# Patient Record
Sex: Male | Born: 1960 | Race: White | Hispanic: No | Smoking: Former smoker
Health system: Southern US, Community
[De-identification: ages and names within clinical notes are randomized; demographics above are authoritative.]

## PROBLEM LIST (undated history)

## (undated) DIAGNOSIS — F17210 Nicotine dependence, cigarettes, uncomplicated: Secondary | ICD-10-CM

## (undated) DIAGNOSIS — E039 Hypothyroidism, unspecified: Secondary | ICD-10-CM

## (undated) DIAGNOSIS — I639 Cerebral infarction, unspecified: Secondary | ICD-10-CM

## (undated) HISTORY — DX: Nicotine dependence, cigarettes, uncomplicated: F17.210

---

## 1988-11-28 DIAGNOSIS — F17210 Nicotine dependence, cigarettes, uncomplicated: Secondary | ICD-10-CM

## 1988-11-28 HISTORY — DX: Nicotine dependence, cigarettes, uncomplicated: F17.210

## 2018-07-08 ENCOUNTER — Inpatient Hospital Stay (HOSPITAL_COMMUNITY)
Admission: AD | Admit: 2018-07-08 | Discharge: 2018-07-11 | DRG: 042 | Disposition: A | Discharge status: Home / Self Care | Payer: Self-pay | Source: Other Acute Inpatient Hospital | Attending: Neurology | Admitting: Neurology

## 2018-07-08 DIAGNOSIS — E785 Hyperlipidemia, unspecified: Secondary | ICD-10-CM | POA: Diagnosis present

## 2018-07-08 DIAGNOSIS — I10 Essential (primary) hypertension: Secondary | ICD-10-CM | POA: Diagnosis present

## 2018-07-08 DIAGNOSIS — R29701 NIHSS score 1: Secondary | ICD-10-CM | POA: Diagnosis present

## 2018-07-08 DIAGNOSIS — I63412 Cerebral infarction due to embolism of left middle cerebral artery: Secondary | ICD-10-CM

## 2018-07-08 DIAGNOSIS — K7689 Other specified diseases of liver: Secondary | ICD-10-CM | POA: Diagnosis present

## 2018-07-08 DIAGNOSIS — I63411 Cerebral infarction due to embolism of right middle cerebral artery: Principal | ICD-10-CM | POA: Diagnosis present

## 2018-07-08 DIAGNOSIS — E039 Hypothyroidism, unspecified: Secondary | ICD-10-CM | POA: Diagnosis present

## 2018-07-08 DIAGNOSIS — E538 Deficiency of other specified B group vitamins: Secondary | ICD-10-CM | POA: Diagnosis present

## 2018-07-08 DIAGNOSIS — I2510 Atherosclerotic heart disease of native coronary artery without angina pectoris: Secondary | ICD-10-CM | POA: Diagnosis present

## 2018-07-08 DIAGNOSIS — I700 Atherosclerosis of aorta: Secondary | ICD-10-CM | POA: Diagnosis present

## 2018-07-08 DIAGNOSIS — R4701 Aphasia: Secondary | ICD-10-CM | POA: Diagnosis present

## 2018-07-08 DIAGNOSIS — F17210 Nicotine dependence, cigarettes, uncomplicated: Secondary | ICD-10-CM | POA: Diagnosis present

## 2018-07-08 DIAGNOSIS — Z9282 Status post administration of tPA (rtPA) in a different facility within the last 24 hours prior to admission to current facility: Secondary | ICD-10-CM

## 2018-07-08 DIAGNOSIS — I639 Cerebral infarction, unspecified: Secondary | ICD-10-CM

## 2018-07-08 DIAGNOSIS — R9389 Abnormal findings on diagnostic imaging of other specified body structures: Secondary | ICD-10-CM

## 2018-07-08 DIAGNOSIS — F17200 Nicotine dependence, unspecified, uncomplicated: Secondary | ICD-10-CM | POA: Diagnosis present

## 2018-07-08 HISTORY — DX: Cerebral infarction, unspecified: I63.9

## 2018-07-08 HISTORY — DX: Hypothyroidism, unspecified: E03.9

## 2018-07-08 LAB — MRSA PCR SCREENING: MRSA by PCR: NEGATIVE

## 2018-07-08 MED ORDER — SODIUM CHLORIDE 0.9 % IV SOLN
INTRAVENOUS | Status: DC
  Administered 2018-07-08: 22:00:00 via INTRAVENOUS

## 2018-07-08 MED ORDER — ACETAMINOPHEN 160 MG/5ML PO SOLN: 650.0000 mg | ORAL | Status: DC | PRN

## 2018-07-08 MED ORDER — ACETAMINOPHEN 650 MG RE SUPP: 650.0000 mg | RECTAL | Status: DC | PRN

## 2018-07-08 MED ORDER — STROKE: EARLY STAGES OF RECOVERY BOOK
Freq: Once | Status: AC
  Administered 2018-07-09: 12:00:00
  Filled 2018-07-08: qty 1

## 2018-07-08 MED ORDER — ACETAMINOPHEN 325 MG PO TABS: 650.0000 mg | ORAL_TABLET | ORAL | Status: DC | PRN

## 2018-07-08 MED ORDER — LABETALOL HCL 5 MG/ML IV SOLN: 10.0000 mg | INTRAVENOUS | Status: DC | PRN

## 2018-07-08 NOTE — Consult Note (Signed)
Please refer H&P

## 2018-07-09 ENCOUNTER — Inpatient Hospital Stay (HOSPITAL_COMMUNITY): Payer: Self-pay

## 2018-07-09 ENCOUNTER — Other Ambulatory Visit: Payer: Self-pay

## 2018-07-09 ENCOUNTER — Encounter (HOSPITAL_COMMUNITY): Payer: Self-pay

## 2018-07-09 ENCOUNTER — Other Ambulatory Visit (HOSPITAL_COMMUNITY): Payer: Self-pay

## 2018-07-09 DIAGNOSIS — I639 Cerebral infarction, unspecified: Secondary | ICD-10-CM

## 2018-07-09 DIAGNOSIS — I6789 Other cerebrovascular disease: Secondary | ICD-10-CM

## 2018-07-09 LAB — BASIC METABOLIC PANEL
ANION GAP: 8 (ref 5–15)
BUN: 12 mg/dL (ref 6–20)
CO2: 29 mmol/L (ref 22–32)
Calcium: 9.2 mg/dL (ref 8.9–10.3)
Chloride: 103 mmol/L (ref 98–111)
Creatinine, Ser: 1.08 mg/dL (ref 0.61–1.24)
GFR calc Af Amer: >60 mL/min (ref >60)
Glucose, Bld: 105 mg/dL — ABNORMAL HIGH (ref 70–99)
POTASSIUM: 4.1 mmol/L (ref 3.5–5.1)
SODIUM: 140 mmol/L (ref 135–145)

## 2018-07-09 LAB — CBC
HCT: 43.4 % (ref 39.0–52.0)
Hemoglobin: 14.6 g/dL (ref 13.0–17.0)
MCH: 30.2 pg (ref 26.0–34.0)
MCHC: 33.6 g/dL (ref 30.0–36.0)
MCV: 89.7 fL (ref 78.0–100.0)
PLATELETS: 261 10*3/uL (ref 150–400)
RBC: 4.84 MIL/uL (ref 4.22–5.81)
RDW: 12.6 % (ref 11.5–15.5)
WBC: 7.9 10*3/uL (ref 4.0–10.5)

## 2018-07-09 LAB — ECHOCARDIOGRAM COMPLETE

## 2018-07-09 LAB — LIPID PANEL
Cholesterol: 279 mg/dL — ABNORMAL HIGH (ref 0–200)
HDL: 30 mg/dL — ABNORMAL LOW (ref >40)
LDL CALC: 187 mg/dL — AB (ref 0–99)
Total CHOL/HDL Ratio: 9.3 RATIO
Triglycerides: 308 mg/dL — ABNORMAL HIGH (ref <150)
VLDL: 62 mg/dL — AB (ref 0–40)

## 2018-07-09 LAB — GLUCOSE, CAPILLARY: Glucose-Capillary: 101 mg/dL — ABNORMAL HIGH (ref 70–99)

## 2018-07-09 LAB — VITAMIN B12: Vitamin B-12: 267 pg/mL (ref 180–914)

## 2018-07-09 LAB — HIV ANTIBODY (ROUTINE TESTING W REFLEX): HIV Screen 4th Generation wRfx: NONREACTIVE

## 2018-07-09 LAB — TSH: TSH: 10.162 u[IU]/mL — AB (ref 0.350–4.500)

## 2018-07-09 LAB — RPR: RPR: NONREACTIVE

## 2018-07-09 MED ORDER — LEVOTHYROXINE SODIUM 25 MCG PO TABS
25.0000 ug | ORAL_TABLET | Freq: Every day | ORAL | Status: DC
  Administered 2018-07-11: 25 ug via ORAL
  Filled 2018-07-09: qty 1

## 2018-07-09 MED ORDER — CLOPIDOGREL BISULFATE 75 MG PO TABS: 75.0000 mg | ORAL_TABLET | Freq: Every day | ORAL | Status: DC

## 2018-07-09 MED ORDER — ATORVASTATIN CALCIUM 80 MG PO TABS
80.0000 mg | ORAL_TABLET | Freq: Every day | ORAL | Status: DC
  Administered 2018-07-09 – 2018-07-10 (×2): 80 mg via ORAL
  Filled 2018-07-09 (×2): qty 1

## 2018-07-09 MED ORDER — LABETALOL HCL 5 MG/ML IV SOLN: 10.0000 mg | INTRAVENOUS | Status: DC | PRN

## 2018-07-09 MED ORDER — ASPIRIN EC 325 MG PO TBEC
325.0000 mg | DELAYED_RELEASE_TABLET | Freq: Every day | ORAL | Status: DC
  Administered 2018-07-09: 325 mg via ORAL
  Filled 2018-07-09: qty 1

## 2018-07-09 MED ORDER — VITAMIN B-12 1000 MCG PO TABS
1000.0000 ug | ORAL_TABLET | Freq: Every day | ORAL | Status: DC
  Administered 2018-07-11: 1000 ug via ORAL
  Filled 2018-07-09 (×2): qty 1

## 2018-07-09 MED ORDER — ASPIRIN EC 81 MG PO TBEC: 81.0000 mg | DELAYED_RELEASE_TABLET | Freq: Every day | ORAL | Status: DC

## 2018-07-09 NOTE — H&P (Addendum)
Chief Complaint: Aphasia  History obtained from: Patient and Chart    HPI:                                                                                                                                       Jordan Mendoza is an 57 y.o. male with no significant past medical history presented to [facility] emergency room as a stroke alert for aphasia, received TPA and transferred for further management to [facility].  History obtained from records sent with patient as well as speaking to patient.  Girlfriend who witnessed the event was not with the patient.   The patient apparently was not feeling like himself all day and fatigued after coming back from church.  According telemetry neurology note, he was with his girlfriend when around 5 pm he developed sudden onset difficulty speaking and has started having right arm jerking/twitching movements.  On arrival to ED he continued to have incomprehensible speech.  NIHSS was 1.  He no  motor weakness, facial droop or sensory symptoms.  Patient received IV TPA and transferred to [facility] emergency room.    Patient denies history of headaches/migraines.  He does not see a doctor frequently therefore unclear if he has any other medical problems.   Date last known well: Yesterday vs 5pm tPA Given: yes, by outside EDP NIHSS: 1 Baseline MRS 0   No past medical history on file.  No significant past medical history  No family history on file. Social History:  has no tobacco, alcohol, and drug history on file.  Allergies: Allergies not on file  Medications:                                                                                                                        I reviewed home medications   ROS:  14 systems reviewed and negative except above    Examination:                                                                                                       General: Appears well-developed and well-nourished.  Psych: Affect appropriate to situation Eyes: No scleral injection HENT: No OP obstrucion Head: Normocephalic.  Cardiovascular: Normal rate and regular rhythm.  Respiratory: Effort normal and breath sounds normal to anterior ascultation GI: Soft.  No distension. There is no tenderness.  Skin: WDI    Neurological Examination Mental Status: Alert, oriented, thought content appropriate. Mild expressive aphasia.  Able to follow 3 step commands without difficulty. Cranial Nerves: II: Visual fields grossly normal,  III,IV, VI: ptosis not present, extra-ocular motions intact bilaterally, pupils equal, round, reactive to light and accommodation V,VII: smile symmetric, facial light touch sensation normal bilaterally VIII: hearing normal bilaterally IX,X: uvula rises symmetrically XI: bilateral shoulder shrug XII: midline tongue extension Motor: Right : Upper extremity   5/5    Left:     Upper extremity   5/5  Lower extremity   5/5     Lower extremity   5/5 Tone and bulk:normal tone throughout; no atrophy noted Sensory: Pinprick and light touch intact throughout, bilaterally Deep Tendon Reflexes: 2+ and symmetric throughout Plantars: Right: downgoing   Left: downgoing Cerebellar: normal finger-to-nose, normal rapid alternating movements and normal heel-to-shin test Gait: normal gait and station     Lab Results: Basic Metabolic Panel: No results for input(s): NA, K, CL, CO2, GLUCOSE, BUN, CREATININE, CALCIUM, MG, PHOS in the last 168 hours.  CBC: No results for input(s): WBC, NEUTROABS, HGB, HCT, MCV, PLT in the last 168 hours.  Coagulation Studies: No results for input(s): LABPROT, INR in the last 72 hours.  Imaging: No results found.   ASSESSMENT AND PLAN    Possible Acute stroke vs Seizures vs Migraine  S/P IV tPA  Recommend # MRI of the brain  without contrast #MRA Head and neck  #Transthoracic Echo  #No antiplatelets until 24 hours #Repeat CT scan at 24 hours to rule out hemorrhage #Start or continue Atorvastatin 40 mg/other high intensity statin # BP goal: permissive HTN upto 180 /105 mmHg # HBAIC and Lipid profile # Telemetry monitoring # Frequent neuro checks # stroke swallow screen  Possible Seizure # Routine EEG tomorrow   Blood pressure goals <180/105 mmHg PRN labetolol ordered   DVT PPX; SCD  Please page stroke NP  Or  PA  Or MD from 8am -4 pm  as this patient from this time will be  followed by the stroke.   You can look them up on [email or web address]  Password [email or web address]     This patient is neurologically critically ill due to possible stroke and received IV tpa.  He is at risk for significant risk of neurological worsening from cerebral edema,  death from brain herniation, heart failure, hemorrhagic conversion, infection, respiratory failure and seizure. This patient's care requires constant monitoring of vital signs, hemodynamics, respiratory and cardiac monitoring, review of multiple databases, neurological assessment,  discussion with family, other specialists and medical decision making of high complexity.  I spent 50  minutes of neurocritical time in the care of this patient.     Sushanth Aroor [facility] Pager Number [phone]

## 2018-07-09 NOTE — Progress Notes (Signed)
Preliminary notes--Bilateral carotid duplex exam completed. Antegrade flow bilateral vertebral arteries.  1-39% stenosis bilateral ICAs.  Intimal thickening bilateral CCAs.  Jordan Mendoza (RDMS RVT) 07/09/18' 2:02 PM

## 2018-07-09 NOTE — H&P (View-Only) (Signed)
STROKE TEAM PROGRESS NOTE   SUBJECTIVE (INTERVAL HISTORY) His EEG tech is at the bedside for EEG.  Pt awake alert, seems still has mild word finding difficulty but much improved as per pt. He stated that he went to church and sent his friend home but they could not understood what he said. He went home, did not feel well. Could not speak at all. He also had right arm tremor, short lasting, but not at left arm. No LOC or no pass out. Dro[place] and [place] and transfer over here.    OBJECTIVE Vitals:   07/09/18 0630 07/09/18 0700 07/09/18 0730 07/09/18 0800  BP: 134/86 128/88 (!) 143/101 136/84  Pulse: 70 73 70 70  Resp: 12 11 11 14   Temp:    98.6 F (37 C)  TempSrc:    Oral  SpO2: 96% 96% 99% 95%    CBC:  Recent Labs  Lab 07/09/18 0715  WBC 7.9  HGB 14.6  HCT 43.4  MCV 89.7  PLT 261    Basic Metabolic Panel:  Recent Labs  Lab 07/09/18 0715  NA 140  K 4.1  CL 103  CO2 29  GLUCOSE 105*  BUN 12  CREATININE 1.08  CALCIUM 9.2    Lipid Panel:     Component Value Date/Time   CHOL 279 (H) 07/09/2018 0228   TRIG 308 (H) 07/09/2018 0228   HDL 30 (L) 07/09/2018 0228   CHOLHDL 9.3 07/09/2018 0228   VLDL 62 (H) 07/09/2018 0228   LDLCALC 187 (H) 07/09/2018 0228   HgbA1c: No results found for: HGBA1C Urine Drug Screen: No results found for: LABOPIA, COCAINSCRNUR, LABBENZ, AMPHETMU, THCU, LABBARB  Alcohol Level No results found for: [facility]  IMAGING I have personally reviewed the radiological images below and agree with the radiology interpretations.  CT head in [place] - no acute abnormality  CT[place] - unremarkable  MRI Brain Wo Contrast - pending  Transthoracic Echocardiogram - pending   PHYSICAL EXAM  Temp:  [98.6 F (37 C)-99 F (37.2 C)] 98.6 F (37 C) (08/12 0800) Pulse Rate:  [69-90] 78 (08/12 0900) Resp:  [11-17] 14 (08/12 0900) BP: (124-170)/(83-107) 159/106 (08/12 0900) SpO2:  [92 %-99 %] 99 % (08/12 0900)  General -  Well nourished, well developed, in no apparent distress.  Ophthalmologic - fundi not visualized due to noncooperation.  Cardiovascular - Regular rate and rhythm.  Mental Status -  Level of arousal and orientation to time, place, and person were intact. Language exam showed expression with intermittent word finding difficulty, naming 3/4, repetition intact, comprehension intact.  Fund of Knowledge was assessed and was intact.  Cranial Nerves II - XII - II - Visual field intact OU. III, IV, VI - Extraocular movements intact. V - Facial sensation intact bilaterally. VII - Facial movement intact bilaterally. VIII - Hearing & vestibular intact bilaterally. X - Palate elevates symmetrically. XI - Chin turning & shoulder shrug intact bilaterally. XII - Tongue protrusion intact.  Motor Strength - The patient's strength was normal in all extremities and pronator drift was absent.  Bulk was normal and fasciculations were absent.   Motor Tone - Muscle tone was assessed at the neck and appendages and was normal.  Reflexes - The patient's reflexes were symmetrical in all extremities and he had no pathological reflexes.  Sensory - Light touch, temperature/pinprick were assessed and were symmetrical.    Coordination - The patient had normal movements in the hands and  feet with no ataxia or dysmetria.  Tremor was absent.  Gait and Station - deferred.   ASSESSMENT/PLAN [name] is a 57 y.o. male with no significant PMHX but no regular medical follow up presenting with speech difficulties. [facility]  Stroke vs. seizure:  MRI pending  Resultant  Mild word finding difficulty  CT head - no acute abnormality  CTA head and neck - unremarkable  MRI head - pending  2D Echo - pending  EEG - pending  LDL - 187  HgbA1c - pending  UDS - pending  VTE prophylaxis - SCDs  Diet - Heart Healthy with thin liquids  No antithrombotic prior to  admission, now on No antithrombotic S/P TPA  Ongoing aggressive stroke risk factor management  Therapy recommendations:  pending  Disposition:  Pending  Hypothyroidism  TSH 10.162  Likely new diagnosis  Add synthroid low dose  Follow up with PCP  Hypertension  Stable  SBP 160s on presentation . Permissive hypertension (OK if < 180/105) but gradually normalize in 5-7 days . Long-term BP goal normotensive  Hyperlipidemia  Lipid lowering medication PTA:  none  LDL 187, goal < 70  Current lipid lowering medication: Start Lipitor 80 mg daily  Continue statin at discharge  Tobacco abuse  Current smoker  Smoking cessation counseling provided  Pt is willing to quit  Other Stroke Risk Factors  ETOH use, advised to drink no more than 2 drink(s) a day  Other Active Problems  B12 = 267 - will give supplement, goal > 500   Hospital day # 1  This patient is critically ill due to stroke vs. Seizure s/p tPA, hypothyroidism, HLD and at significant risk of neurological worsening, death form recurrent stroke, hemorrhage, cerebral edema. This patient's care requires constant monitoring of vital signs, hemodynamics, respiratory and cardiac monitoring, review of multiple databases, neurological assessment, discussion with family, other specialists and medical decision making of high complexity. I spent 40 minutes of neurocritical care time in the care of this patient.  Marvel PlanJindong Lovey Crupi, MD PhD Stroke Neurology 07/09/2018 10:29 AM     To contact Stroke Continuity provider, please refer to [email or web address]. After hours, contact General Neurology

## 2018-07-09 NOTE — Procedures (Signed)
ELECTROENCEPHALOGRAM REPORT   Patient: Jordan Mendoza       Room #: 4N23C EEG No. ID: [id number] Age: 57 y.o.        Sex: male Referring Physician: Roda ShuttersXu Report Date:  07/09/2018        Interpreting Physician: Thana FarrEYNOLDS, Kaoir Loree  History: Jordan Peltimothy Dubs is an 57 y.o. male with aphasia  Medications:  B12, Lipitor, Synthroid  Conditions of Recording:  This is a 21 channel routine scalp EEG performed with bipolar and monopolar montages arranged in accordance to the international 10/20 system of electrode placement. One channel was dedicated to EKG recording.  The patient is in the awake state.  Description:  The waking background activity consists of a low voltage, symmetrical, fairly well organized, 10 Hz alpha activity, seen from the parieto-occipital and posterior temporal regions.  Low voltage fast activity, poorly organized, is seen anteriorly and is at times superimposed on more posterior regions.  A mixture of theta and alpha rhythms are seen from the central and temporal regions. The patient does not drowse or sleep. No epileptiform activity is noted.   Hyperventilation and intermittent photic stimulation were not performed.  IMPRESSION: This is a normal awake electroencephalogram. There are no focal lateralizing or epileptiform features.   Comment:  An EEG with the patient sleep deprived to elicit drowse and light sleep may be desirable to further elicit a possible seizure disorder.     Thana FarrLeslie Avan Gullett, MD Neurology [phone] 07/09/2018, 12:45 PM

## 2018-07-09 NOTE — Progress Notes (Signed)
  Echocardiogram 2D Echocardiogram has been performed.  Jordan Mendoza 07/09/2018, 10:58 AM

## 2018-07-09 NOTE — Progress Notes (Signed)
PT Cancellation Note  Patient Details Name: Jordan Mendoza MRN: [id number] DOB: Apr 15, 1961   Cancelled Treatment:    Reason Eval/Treat Not Completed: Active bedrest order; will attempt later as time permits if updated activity order.   Elray McgregorCynthia Wynn 07/09/2018, 9:24 AM Sheran Lawlessyndi Wynn, PT [phone] 07/09/2018

## 2018-07-09 NOTE — Evaluation (Signed)
Physical Therapy Evaluation Patient Details Name: Jordan Mendoza MRN: [id number] DOB: 1961/03/12 Today's Date: 07/09/2018   History of Present Illness  [name] is a 57 y.o. male with no significant PMHX but no regular medical follow up presenting with speech difficulties.  S/p TPA at [place].  Clinical Impression  Patient presents with decreased independence with mobility due to general immobility and imbalance.  Feel he will benefit from skilled PT in the acute setting to allow return home with family support at d/c.  Will follow for higher level balance training, safety and further stroke education.     Follow Up Recommendations No PT follow up    Equipment Recommendations  None recommended by PT    Recommendations for Other Services       Precautions / Restrictions Precautions Precautions: Fall      Mobility  Bed Mobility Overal bed mobility: Needs Assistance Bed Mobility: Supine to Sit     Supine to sit: Min guard;HOB elevated     General bed mobility comments: Assist for lines  Transfers Overall transfer level: Needs assistance Equipment used: None Transfers: Sit to/from Stand Sit to Stand: Min guard         General transfer comment: for balance/safety, reaching to hold IV pole to steady himself  Ambulation/Gait Ambulation/Gait assistance: Min guard Gait Distance (Feet): 300 Feet Assistive device: None Gait Pattern/deviations: Step-through pattern;Decreased stride length     General Gait Details: initially pushed IV pole, then asked not to and able to walk without UE support  Stairs            Wheelchair Mobility    Modified Rankin (Stroke Patients Only) Modified Rankin (Stroke Patients Only) Pre-Morbid Rankin Score: No symptoms Modified Rankin: Moderately severe disability     Balance Overall balance assessment: Needs assistance   Sitting balance-Leahy Scale: Good       Standing balance-Leahy Scale: Fair                                Pertinent Vitals/Pain Pain Assessment: No/denies pain    Home Living Family/patient expects to be discharged to:: Private residence Living Arrangements: Spouse/significant other Available Help at Discharge: Available 24 hours/day;Friend(s) Type of Home: House Home Access: Stairs to enter   [employer] Corporation[employer]trance Stairs-Number of Steps: 1-2 Home Layout: One level Home Equipment: None      Prior Function Level of Independence: Independent         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     Hand Dominance   Dominant Hand: Left    Extremity/Trunk Assessment   Upper Extremity Assessment Upper Extremity Assessment: Overall WFL for tasks assessed    Lower Extremity Assessment Lower Extremity Assessment: Overall WFL for tasks assessed       Communication   Communication: Expressive difficulties  Cognition Arousal/Alertness: Awake/alert Behavior During Therapy: WFL for tasks assessed/performed Overall Cognitive Status: Impaired/Different from baseline Area of Impairment: Problem solving                             Problem Solving: Slow processing General Comments: little slowed, but oriented x 4      General Comments      Exercises     Assessment/Plan    PT Assessment Patient needs continued PT services  PT Problem List Decreased mobility;Decreased balance;Decreased knowledge of use of DME;Decreased safety awareness;Decreased knowledge of precautions  PT Treatment Interventions DME instruction;Therapeutic activities;Gait training;Therapeutic exercise;Patient/family education;Functional mobility training;Stair training;Balance training    PT Goals (Current goals can be found in the Care Plan section)  Acute Rehab PT Goals Patient Stated Goal: to return to normal PT Goal Formulation: With patient/family Time For Goal Achievement: 07/16/18 Potential to Achieve Goals: Good    Frequency Min 4X/week   Barriers to discharge         Co-evaluation               AM-PAC PT "6 Clicks" Daily Activity  Outcome Measure Difficulty turning over in bed (including adjusting bedclothes, sheets and blankets)?: A Little Difficulty moving from lying on back to sitting on the side of the bed? : A Little Difficulty sitting down on and standing up from a chair with arms (e.g., wheelchair, bedside commode, etc,.)?: A Little Help needed moving to and from a bed to chair (including a wheelchair)?: A Little Help needed walking in hospital room?: A Little Help needed climbing 3-5 steps with a railing? : A Little 6 Click Score: 18    End of Session Equipment Utilized During Treatment: Gait belt Activity Tolerance: Patient tolerated treatment well Patient left: with call bell/phone within reach;in chair;with chair alarm set;with family/visitor present   PT Visit Diagnosis: Other abnormalities of gait and mobility (R26.89)    Time: 1450-1516 PT Time Calculation (min) (ACUTE ONLY): 26 min   Charges:   PT Evaluation $PT Eval Moderate Complexity: 1 Mod PT Treatments $Gait Training: 8-22 mins        Jordan Mendoza, [place] CarolinaPT [phone] 07/09/2018   Jordan Mendoza 07/09/2018, 4:53 PM

## 2018-07-09 NOTE — Progress Notes (Signed)
STROKE TEAM PROGRESS NOTE   SUBJECTIVE (INTERVAL HISTORY) His EEG tech is at the bedside for EEG.  Pt awake alert, seems still has mild word finding difficulty but much improved as per pt. He stated that he went to church and sent his friend home but they could not understood what he said. He went home, did not feel well. Could not speak at all. He also had right arm tremor, short lasting, but not at left arm. No LOC or no pass out. Drove to [place] and got tPA and transfer over here.    OBJECTIVE Vitals:   07/09/18 0630 07/09/18 0700 07/09/18 0730 07/09/18 0800  BP: 134/86 128/88 (!) 143/101 136/84  Pulse: 70 73 70 70  Resp: 12 11 11 14  Temp:    98.6 F (37 C)  TempSrc:    Oral  SpO2: 96% 96% 99% 95%    CBC:  Recent Labs  Lab 07/09/18 0715  WBC 7.9  HGB 14.6  HCT 43.4  MCV 89.7  PLT 261    Basic Metabolic Panel:  Recent Labs  Lab 07/09/18 0715  NA 140  K 4.1  CL 103  CO2 29  GLUCOSE 105*  BUN 12  CREATININE 1.08  CALCIUM 9.2    Lipid Panel:     Component Value Date/Time   CHOL 279 (H) 07/09/2018 0228   TRIG 308 (H) 07/09/2018 0228   HDL 30 (L) 07/09/2018 0228   CHOLHDL 9.3 07/09/2018 0228   VLDL 62 (H) 07/09/2018 0228   LDLCALC 187 (H) 07/09/2018 0228   HgbA1c: No results found for: HGBA1C Urine Drug Screen: No results found for: LABOPIA, COCAINSCRNUR, LABBENZ, AMPHETMU, THCU, LABBARB  Alcohol Level No results found for: ETH  IMAGING I have personally reviewed the radiological images below and agree with the radiology interpretations.  CT head in [place] - no acute abnormality  CTA head and neck in [place] - unremarkable  MRI Brain Wo Contrast - pending  Transthoracic Echocardiogram - pending   PHYSICAL EXAM  Temp:  [98.6 F (37 C)-99 F (37.2 C)] 98.6 F (37 C) (08/12 0800) Pulse Rate:  [69-90] 78 (08/12 0900) Resp:  [11-17] 14 (08/12 0900) BP: (124-170)/(83-107) 159/106 (08/12 0900) SpO2:  [92 %-99 %] 99 % (08/12 0900)  General -  Well nourished, well developed, in no apparent distress.  Ophthalmologic - fundi not visualized due to noncooperation.  Cardiovascular - Regular rate and rhythm.  Mental Status -  Level of arousal and orientation to time, place, and person were intact. Language exam showed expression with intermittent word finding difficulty, naming 3/4, repetition intact, comprehension intact.  Fund of Knowledge was assessed and was intact.  Cranial Nerves II - XII - II - Visual field intact OU. III, IV, VI - Extraocular movements intact. V - Facial sensation intact bilaterally. VII - Facial movement intact bilaterally. VIII - Hearing & vestibular intact bilaterally. X - Palate elevates symmetrically. XI - Chin turning & shoulder shrug intact bilaterally. XII - Tongue protrusion intact.  Motor Strength - The patient's strength was normal in all extremities and pronator drift was absent.  Bulk was normal and fasciculations were absent.   Motor Tone - Muscle tone was assessed at the neck and appendages and was normal.  Reflexes - The patient's reflexes were symmetrical in all extremities and he had no pathological reflexes.  Sensory - Light touch, temperature/pinprick were assessed and were symmetrical.    Coordination - The patient had normal movements in the hands and   feet with no ataxia or dysmetria.  Tremor was absent.  Gait and Station - deferred.   ASSESSMENT/PLAN Jordan Mendoza is a 57 y.o. male with no significant PMHX but no regular medical follow up presenting with speech difficulties. [facility]  Stroke vs. seizure:  MRI pending  Resultant  Mild word finding difficulty  CT head - no acute abnormality  CTA head and neck - unremarkable  MRI head - pending  2D Echo - pending  EEG - pending  LDL - 187  HgbA1c - pending  UDS - pending  VTE prophylaxis - SCDs  Diet - Heart Healthy with thin liquids  No antithrombotic prior to  admission, now on No antithrombotic S/P TPA  Ongoing aggressive stroke risk factor management  Therapy recommendations:  pending  Disposition:  Pending  Hypothyroidism  TSH 10.162  Likely new diagnosis  Add synthroid low dose  Follow up with PCP  Hypertension  Stable  SBP 160s on presentation . Permissive hypertension (OK if < 180/105) but gradually normalize in 5-7 days . Long-term BP goal normotensive  Hyperlipidemia  Lipid lowering medication PTA:  none  LDL 187, goal < 70  Current lipid lowering medication: Start Lipitor 80 mg daily  Continue statin at discharge  Tobacco abuse  Current smoker  Smoking cessation counseling provided  Pt is willing to quit  Other Stroke Risk Factors  ETOH use, advised to drink no more than 2 drink(s) a day  Other Active Problems  B12 = 267 - will give supplement, goal > 500   Hospital day # 1  This patient is critically ill due to stroke vs. Seizure s/p tPA, hypothyroidism, HLD and at significant risk of neurological worsening, death form recurrent stroke, hemorrhage, cerebral edema. This patient's care requires constant monitoring of vital signs, hemodynamics, respiratory and cardiac monitoring, review of multiple databases, neurological assessment, discussion with family, other specialists and medical decision making of high complexity. I spent 40 minutes of neurocritical care time in the care of this patient.  [name], MD PhD Stroke Neurology 07/09/2018 10:29 AM     To contact Stroke Continuity provider, please refer to [email or web address]. After hours, contact General Neurology

## 2018-07-09 NOTE — Progress Notes (Signed)
EEG completed; results pending.    

## 2018-07-09 NOTE — Progress Notes (Signed)
Pt passed stroke swallow screen without any complications, will notify MD for diet order.

## 2018-07-09 NOTE — Progress Notes (Signed)
OT Cancellation Note  Patient Details Name: Pete Peltimothy Choudhry MRN: [id number] DOB: [birth year]   Cancelled Treatment:    Reason Eval/Treat Not Completed: Patient not medically ready(strict bedrest) OT order received and appreciated however this conflicts with current bedrest order set. Please increase activity tolerance as appropriate and remove bedrest from orders. . Please contact OT at [phone] if bed rest order is discontinued. OT will hold evaluation at this time and will check back as time allows pending increased activity orders.   Felecia ShellingJones, Yasser Hepp B   Peirce Deveney, Brynn   OTR/L Pager: [phone] Office: [phone] .  07/09/2018, 6:52 AM

## 2018-07-10 ENCOUNTER — Encounter (HOSPITAL_COMMUNITY)
Admission: AD | Disposition: A | Discharge status: Home / Self Care | Payer: Self-pay | Source: Other Acute Inpatient Hospital | Attending: Neurology

## 2018-07-10 ENCOUNTER — Inpatient Hospital Stay (HOSPITAL_COMMUNITY): Payer: Self-pay

## 2018-07-10 ENCOUNTER — Encounter (HOSPITAL_COMMUNITY): Payer: Self-pay

## 2018-07-10 DIAGNOSIS — F17200 Nicotine dependence, unspecified, uncomplicated: Secondary | ICD-10-CM | POA: Diagnosis present

## 2018-07-10 DIAGNOSIS — I63412 Cerebral infarction due to embolism of left middle cerebral artery: Secondary | ICD-10-CM

## 2018-07-10 DIAGNOSIS — E538 Deficiency of other specified B group vitamins: Secondary | ICD-10-CM | POA: Diagnosis present

## 2018-07-10 DIAGNOSIS — I6389 Other cerebral infarction: Secondary | ICD-10-CM

## 2018-07-10 DIAGNOSIS — E785 Hyperlipidemia, unspecified: Secondary | ICD-10-CM | POA: Diagnosis present

## 2018-07-10 DIAGNOSIS — E039 Hypothyroidism, unspecified: Secondary | ICD-10-CM

## 2018-07-10 DIAGNOSIS — R9389 Abnormal findings on diagnostic imaging of other specified body structures: Secondary | ICD-10-CM

## 2018-07-10 DIAGNOSIS — I63411 Cerebral infarction due to embolism of right middle cerebral artery: Secondary | ICD-10-CM | POA: Diagnosis present

## 2018-07-10 HISTORY — PX: LOOP RECORDER INSERTION: EP1214

## 2018-07-10 HISTORY — PX: TEE WITHOUT CARDIOVERSION: SHX5443

## 2018-07-10 HISTORY — DX: Hypothyroidism, unspecified: E03.9

## 2018-07-10 LAB — BASIC METABOLIC PANEL
Anion gap: 9 (ref 5–15)
BUN: 14 mg/dL (ref 6–20)
CALCIUM: 9.1 mg/dL (ref 8.9–10.3)
CO2: 28 mmol/L (ref 22–32)
CREATININE: 1.11 mg/dL (ref 0.61–1.24)
Chloride: 104 mmol/L (ref 98–111)
GFR calc Af Amer: >60 mL/min (ref >60)
Glucose, Bld: 97 mg/dL (ref 70–99)
Potassium: 4.1 mmol/L (ref 3.5–5.1)
SODIUM: 141 mmol/L (ref 135–145)

## 2018-07-10 LAB — CBC
HCT: 41.2 % (ref 39.0–52.0)
Hemoglobin: 13.8 g/dL (ref 13.0–17.0)
MCH: 30.3 pg (ref 26.0–34.0)
MCHC: 33.5 g/dL (ref 30.0–36.0)
MCV: 90.4 fL (ref 78.0–100.0)
PLATELETS: 244 10*3/uL (ref 150–400)
RBC: 4.56 MIL/uL (ref 4.22–5.81)
RDW: 12.7 % (ref 11.5–15.5)
WBC: 8.2 10*3/uL (ref 4.0–10.5)

## 2018-07-10 LAB — HEMOGLOBIN A1C
HEMOGLOBIN A1C: 5.5 % (ref 4.8–5.6)
Mean Plasma Glucose: 111 mg/dL

## 2018-07-10 SURGERY — ECHOCARDIOGRAM, TRANSESOPHAGEAL
Anesthesia: Moderate Sedation

## 2018-07-10 SURGERY — LOOP RECORDER INSERTION

## 2018-07-10 MED ORDER — FENTANYL CITRATE (PF) 100 MCG/2ML IJ SOLN
INTRAMUSCULAR | Status: AC
  Filled 2018-07-10: qty 2

## 2018-07-10 MED ORDER — LIDOCAINE-EPINEPHRINE 1 %-1:100000 IJ SOLN
INTRAMUSCULAR | Status: DC | PRN
  Administered 2018-07-10: 10 mL

## 2018-07-10 MED ORDER — ONDANSETRON HCL 4 MG/2ML IJ SOLN: 4.0000 mg | Freq: Four times a day (QID) | INTRAMUSCULAR | Status: DC | PRN

## 2018-07-10 MED ORDER — CLOPIDOGREL BISULFATE 75 MG PO TABS: 75.0000 mg | ORAL_TABLET | Freq: Every day | ORAL | 0 refills | Status: DC

## 2018-07-10 MED ORDER — CLOPIDOGREL BISULFATE 75 MG PO TABS
75.0000 mg | ORAL_TABLET | Freq: Every day | ORAL | Status: DC
  Administered 2018-07-11: 75 mg via ORAL
  Filled 2018-07-10: qty 1

## 2018-07-10 MED ORDER — HEPARIN SODIUM (PORCINE) 5000 UNIT/ML IJ SOLN
5000.0000 [IU] | Freq: Three times a day (TID) | INTRAMUSCULAR | Status: DC
  Administered 2018-07-10 – 2018-07-11 (×2): 5000 [IU] via SUBCUTANEOUS
  Filled 2018-07-10 (×2): qty 1

## 2018-07-10 MED ORDER — MIDAZOLAM HCL 5 MG/5ML IJ SOLN
INTRAMUSCULAR | Status: DC | PRN
  Administered 2018-07-10: 2 mg via INTRAVENOUS
  Administered 2018-07-10: 1 mg via INTRAVENOUS
  Administered 2018-07-10: 2 mg via INTRAVENOUS

## 2018-07-10 MED ORDER — LIDOCAINE-EPINEPHRINE 1 %-1:100000 IJ SOLN
INTRAMUSCULAR | Status: AC
  Filled 2018-07-10: qty 1

## 2018-07-10 MED ORDER — SODIUM CHLORIDE 0.9 % IV SOLN
INTRAVENOUS | Status: DC
  Administered 2018-07-10: 500 mL via INTRAVENOUS

## 2018-07-10 MED ORDER — FENTANYL CITRATE (PF) 100 MCG/2ML IJ SOLN
INTRAMUSCULAR | Status: DC | PRN
  Administered 2018-07-10 (×2): 25 ug via INTRAVENOUS

## 2018-07-10 MED ORDER — ATORVASTATIN CALCIUM 80 MG PO TABS: 80.0000 mg | ORAL_TABLET | Freq: Every day | ORAL | 2 refills | Status: DC

## 2018-07-10 MED ORDER — ACETAMINOPHEN 325 MG PO TABS
325.0000 mg | ORAL_TABLET | ORAL | Status: DC | PRN
  Administered 2018-07-10: 650 mg via ORAL
  Filled 2018-07-10 (×2): qty 2

## 2018-07-10 MED ORDER — ASPIRIN EC 81 MG PO TBEC
81.0000 mg | DELAYED_RELEASE_TABLET | Freq: Every day | ORAL | Status: DC
  Administered 2018-07-11: 81 mg via ORAL
  Filled 2018-07-10: qty 1

## 2018-07-10 MED ORDER — BUTAMBEN-TETRACAINE-BENZOCAINE 2-2-14 % EX AERO
INHALATION_SPRAY | CUTANEOUS | Status: DC | PRN
  Administered 2018-07-10: 2 via TOPICAL

## 2018-07-10 MED ORDER — ASPIRIN 81 MG PO TBEC: 81.0000 mg | DELAYED_RELEASE_TABLET | Freq: Every day | ORAL | Status: AC

## 2018-07-10 MED ORDER — MIDAZOLAM HCL 5 MG/ML IJ SOLN
INTRAMUSCULAR | Status: AC
  Filled 2018-07-10: qty 2

## 2018-07-10 MED ORDER — LEVOTHYROXINE SODIUM 25 MCG PO TABS: 25.0000 ug | ORAL_TABLET | Freq: Every day | ORAL | 2 refills | Status: DC

## 2018-07-10 MED ORDER — CYANOCOBALAMIN 1000 MCG PO TABS: 1000.0000 ug | ORAL_TABLET | Freq: Every day | ORAL | 2 refills | Status: AC

## 2018-07-10 SURGICAL SUPPLY — 2 items
LOOP REVEAL LINQSYS (Prosthesis & Implant Heart) ×2 IMPLANT
PACK LOOP INSERTION (CUSTOM PROCEDURE TRAY) ×2 IMPLANT

## 2018-07-10 NOTE — Consult Note (Addendum)
ELECTROPHYSIOLOGY CONSULT NOTE  Patient ID: Jordan Mendoza MRN: [id number], DOB/AGE: Jul 23, 1961   Admit date: 07/08/2018 Date of Consult: 07/10/2018  Primary Physician: Patient, No Pcp Per Primary Cardiologist: new to [facility] Reason for Consultation: Cryptogenic stroke; recommendations regarding Implantable Loop Recorder  History of Present Illness EP has been asked to evaluate Pete Pelt for placement of an implantable loop recorder to monitor for atrial fibrillation by Dr Roda Shutters.  The patient was admitted on 07/08/2018 with word finding difficulty.     Imaging demonstrated left MCA vessel infarct and he received tPA at [facility]. He was then transferred to [facility] for further evaluation.  he has undergone workup for stroke including echocardiogram and carotid dopplers.  The patient has been monitored on telemetry which has demonstrated sinus rhythm with no arrhythmias.  Inpatient stroke work-up is to be completed with a TEE.   Echocardiogram this admission demonstrated EF 55-60%, grade 1 diastolic dysfunction, hypokinesis of mid and apical anteroseptal myocardium.  Lab work is reviewed.  Prior to admission, the patient denies chest pain, shortness of breath, dizziness, palpitations, or syncope.  They are recovering from their stroke with plans to return home at discharge.    Past Medical History:  Diagnosis Date  . Smokes 1/2 pack a day or less 1990   started thirty years ago     No medications prior to admission.    Inpatient Medications:  . [MAR Hold] aspirin EC  325 mg Oral Daily  . [MAR Hold] atorvastatin  80 mg Oral q1800  . [MAR Hold] levothyroxine  25 mcg Oral QAC breakfast  . [MAR Hold] vitamin B-12  1,000 mcg Oral Daily    Allergies: No Known Allergies  Social History   Socioeconomic History  . Marital status: Widowed    Spouse name: Not on file  . Number of children: 3  . Years of education: Not on file  . Highest education level: Not on file    Occupational History  . Not on file  Social Needs  . Financial resource strain: Not on file  . Food insecurity:    Worry: Not on file    Inability: Not on file  . Transportation needs:    Medical: Not on file    Non-medical: Not on file  Tobacco Use  . Smoking status: Current Every Day Smoker    Packs/day: 2.00    Years: 30.00    Pack years: 60.00    Types: Cigarettes    Start date: 02/06/1989  . Smokeless tobacco: Never Used  Substance and Sexual Activity  . Alcohol use: Not on file  . Drug use: Never  . Sexual activity: Yes  Lifestyle  . Physical activity:    Days per week: 2 days    Minutes per session: 30 min  . Stress: To some extent  Relationships  . Social connections:    Talks on phone: More than three times a week    Gets together: Not on file    Attends religious service: More than 4 times per year    Active member of club or organization: Not on file    Attends meetings of clubs or organizations: Not on file    Relationship status: Not on file  . Intimate partner violence:    Fear of current or ex partner: No    Emotionally abused: No    Physically abused: No    Forced sexual activity: No  Other Topics Concern  . Not on file  Social  History Narrative  . Not on file    Family History: no premature CAD   Review of Systems: All other systems reviewed and are otherwise negative except as noted above.  Physical Exam: Vitals:   07/09/18 2100 07/09/18 2323 07/10/18 0359 07/10/18 0756  BP:  (!) 146/92 124/87 139/87  Pulse:  75 74 77  Resp:  18  18  Temp:  98.9 F (37.2 C) 98.3 F (36.8 C) 97.7 F (36.5 C)  TempSrc:  Oral Oral Oral  SpO2:  98% 98% 96%  Weight: 90.3 kg     Height: 5\' 7"  (1.702 m)       GEN- The patient is well appearing, alert and oriented x 3 today.   Head- normocephalic, atraumatic Eyes-  Sclera clear, conjunctiva pink Ears- hearing intact Oropharynx- clear Neck- supple Lungs- Clear to ausculation bilaterally, normal work  of breathing Heart- Regular rate and rhythm  GI- soft, NT, ND, + BS Extremities- no clubbing, cyanosis, or edema MS- no significant deformity or atrophy Skin- no rash or lesion Psych- euthymic mood, full affect   Labs:   Lab Results  Component Value Date   WBC 8.2 07/10/2018   HGB 13.8 07/10/2018   HCT 41.2 07/10/2018   MCV 90.4 07/10/2018   PLT 244 07/10/2018    Recent Labs  Lab 07/10/18 0316  NA 141  K 4.1  CL 104  CO2 28  BUN 14  CREATININE 1.11  CALCIUM 9.1  GLUCOSE 97     Radiology/Studies: Mr Brain Wo Contrast  Result Date: 07/09/2018 CLINICAL DATA:  Stroke.  Follow-up after tPA administration. EXAM: MRI HEAD WITHOUT CONTRAST TECHNIQUE: Multiplanar, multiecho pulse sequences of the brain and surrounding structures were obtained without intravenous contrast. COMPARISON:  None. FINDINGS: Brain: There is an approximately 2-3 cm region of patchy acute infarction in the left deep insula and frontal operculum region. Mild swelling but no significant mass effect. There are petechial blood products without frank hematoma. No other region of acute infarction. Elsewhere, the brain appears normal. No evidence of pre-existing small-vessel disease. The brainstem and cerebellum are entirely normal. No mass lesion, hydrocephalus or extra-axial collection. Vascular: Major vessels at the base of the brain show flow. Skull and upper cervical spine: Negative Sinuses/Orbits: Clear/normal Other: None IMPRESSION: Findings consistent with a left MCA branch vessel infarction with a 2-3 cm region of acute infarction in the left deep insula and frontal operculum. Mild swelling. Petechial blood products without frank hematoma. No significant mass effect or shift. Otherwise negative scan. Electronically Signed   By: Paulina FusiMark  Shogry M.D.   On: 07/09/2018 16:18    Telemetry sinus rhythm (personally reviewed)  Assessment and Plan:  1. Cryptogenic stroke The patient presents with cryptogenic stroke.   The patient has a TEE planned for this AM.  I spoke at length with the patient about monitoring for afib with an implantable loop recorder.  Risks, benefits, and alteratives to implantable loop recorder were discussed with the patient today.   At this time, the patient is very clear in their decision to proceed with implantable loop recorder.   2.  Wall motion abnormalities on echo May benefit from outpatient myoview He has not had ischemic symptoms Will await TEE   Wound care was reviewed with the patient (keep incision clean and dry for 3 days).  Wound check scheduled and entered in AVS.  Please call with questions.   Gypsy BalsamAmber Seiler, NP 07/10/2018 10:13 AM  EP Attending  Patient seen and examined. Agree  with the findings as noted above. They represent my assessment and plan as well. He has had a TEE which did not show an explanation for his stroke. He will undergo insertion of an ILR. I have discussed the indications/risks/benefits/goals/expectations of the procedure with the patient and he wishes to proceed.  Leonia ReevesGregg Genesys Coggeshall,M.D.

## 2018-07-10 NOTE — Interval H&P Note (Signed)
History and Physical Interval Note:  07/10/2018 11:02 AM  Jordan Mendoza  has presented today for surgery, with the diagnosis of stroke  The various methods of treatment have been discussed with the patient and family. After consideration of risks, benefits and other options for treatment, the patient has consented to  Procedure(s): TRANSESOPHAGEAL ECHOCARDIOGRAM (TEE) (N/A) as a surgical intervention .  The patient's history has been reviewed, patient examined, no change in status, stable for surgery.  I have reviewed the patient's chart and labs.  Questions were answered to the patient's satisfaction.     Terriana Barreras [employer]

## 2018-07-10 NOTE — Discharge Summary (Addendum)
Stroke Discharge Summary  Patient ID: Jordan Mendoza   MRN: [id number]      DOB: [birth year]  Date of Admission: 07/08/2018 Date of Discharge: 07/11/2018  Attending Physician:  [name], MD, Stroke MD Consultant(s):     Lewayne Bunting, MD (electrophysiology)  Patient's PCP:  Patient, No Pcp Per  DISCHARGE DIAGNOSIS:  Principal Problem:   Cerebral infarction due to embolism of right middle cerebral artery (HCC) s/p tPA Active Problems:   Hypothyroid   Hyperlipidemia   Tobacco use disorder   Low serum vitamin B12   Liver cysts   Coronary atherosclerosis   Past Medical History:  Diagnosis Date  . Hypothyroid 07/10/2018  . Smokes 1/2 pack a day or less 1990   started thirty years ago  . Stroke (cerebrum) (HCC) 07/08/2018   Past Surgical History:  Procedure Laterality Date  . LOOP RECORDER INSERTION N/A 07/10/2018   Procedure: LOOP RECORDER INSERTION;  Surgeon: Marinus Maw, MD;  Location: [facility] INVASIVE CV LAB;  Service: Cardiovascular;  Laterality: N/A;  . TEE WITHOUT CARDIOVERSION N/A 07/10/2018   Procedure: TRANSESOPHAGEAL ECHOCARDIOGRAM (TEE);  Surgeon: Laurey Morale, MD;  Location: [facility] ENDOSCOPY;  Service: Cardiovascular;  Laterality: N/A;    Allergies as of 07/11/2018   No Known Allergies     Medication List    TAKE these medications   aspirin 81 MG EC tablet Take 1 tablet (81 mg total) by mouth daily.   atorvastatin 80 MG tablet Commonly known as:  LIPITOR Take 1 tablet (80 mg total) by mouth daily at 6 PM.   clopidogrel 75 MG tablet Commonly known as:  PLAVIX Take 1 tablet (75 mg total) by mouth daily.   cyanocobalamin 1000 MCG tablet Take 1 tablet (1,000 mcg total) by mouth daily.   levothyroxine 25 MCG tablet Commonly known as:  SYNTHROID, LEVOTHROID Take 1 tablet (25 mcg total) by mouth daily before breakfast.       LABORATORY STUDIES CBC    Component Value Date/Time   WBC 8.1 07/11/2018 0524   RBC 4.58 07/11/2018 0524   HGB 13.5  07/11/2018 0524   HCT 41.4 07/11/2018 0524   PLT 230 07/11/2018 0524   MCV 90.4 07/11/2018 0524   MCH 29.5 07/11/2018 0524   MCHC 32.6 07/11/2018 0524   RDW 12.5 07/11/2018 0524   CMP    Component Value Date/Time   NA 140 07/11/2018 0524   K 3.9 07/11/2018 0524   CL 105 07/11/2018 0524   CO2 27 07/11/2018 0524   GLUCOSE 97 07/11/2018 0524   BUN 15 07/11/2018 0524   CREATININE 1.08 07/11/2018 0524   CALCIUM 8.9 07/11/2018 0524   GFRNONAA >60 07/11/2018 0524   GFRAA >60 07/11/2018 0524   Lipid Panel    Component Value Date/Time   CHOL 279 (H) 07/09/2018 0228   TRIG 308 (H) 07/09/2018 0228   HDL 30 (L) 07/09/2018 0228   CHOLHDL 9.3 07/09/2018 0228   VLDL 62 (H) 07/09/2018 0228   LDLCALC 187 (H) 07/09/2018 0228   HgbA1C  Lab Results  Component Value Date   HGBA1C 5.5 07/09/2018    SIGNIFICANT DIAGNOSTIC STUDIES CXR in [place] - widened mediastinum  CT head in [place] - no acute abnormality  CTA head and neck in [place] - unremarkable  MRI Brain Wo Contrast  Findings consistent with a left MCA branch vessel infarction with a 2-3 cm region of acute infarction in the left deep insula and frontal operculum. Mild swelling. Petechial blood  products without frank hematoma. No significant mass effect or shift. Otherwise negative scan.  Transthoracic Echocardiogram  - Left ventricle: The cavity size was normal. Wall thickness was normal. The estimated ejection fraction was in the range of 55% to 60%. Hypokinesis of the mid and apical anteroseptal and anterior myocardium. Doppler parameters are consistent with abnormal left ventricular relaxation (grade 1 diastolic dysfunction).  TEE Normal LV size with mild focal basal septal hypertrophy.  EF 50-55% with mid to apical anteroseptal and apical anterior severe hypokinesis.  Normal RV size and systolic function.   Normal right atrial size.  Normal left atrial size, no LA appendage thrombus.  Trivial TR.  Trivial MR.   Trileaflet aortic valve with no stenosis or regurgitation.  Normal caliber thoracic aorta with grade 3 plaque in the descending thoracic aorta.  Impression: No source of embolism.  Wall motion abnormalities concerning for prior MI.   Carotid Doppler Antegrade flow bilateral vertebral arteries. 1-39% stenosis bilateral ICAs. Intimal thickening bilateral CCAs  EEG This is a normalawakeelectroencephalogram. There are no focal lateralizing or epileptiform features.  CT chest 07/11/2018 1. Negative for mediastinal mass. Mediastinal widening on chest x-ray was likely technical. 2. Aortic and coronary atherosclerotic calcification. No embolic source seen in the chest in this patient with acute stroke. 3. Numerous cystic densities in the liver.   HISTORY OF PRESENT ILLNESS Jordan Mendoza is an 57 y.o. male with no significant past medical history presented to [facility] emergency room as a stroke alert for aphasia, received TPA and transferred for further management to [facility].  History obtained from records sent with patient as well as speaking to patient.  Girlfriend who witnessed the event was not with the patient.  The patient apparently was not feeling like himself all day and fatigued after coming back from church.  According to the teleneurology note, he was with his girlfriend when around 5 pm on 07/07/2018 he developed sudden onset difficulty speaking and has started having right arm jerking/twitching movements.  On arrival to ED he continued to have incomprehensible speech.  NIHSS was 1.  He had no  motor weakness, facial droop or sensory symptoms.  Patient received IV TPA and transferred to [facility] emergency room.   Patient denies history of headaches/migraines.  He does not see a doctor frequently therefore unclear if he has any other medical problems. NIHSS: 1.  Baseline MRS 0    HOSPITAL COURSE [name] is a 57 y.o. male with no significant  PMHX but no regular medical follow up presenting with speech difficulties. tPA given  at [facility]. Transferred to [facility]. Found to have L MCA infarcts, embolic from unknown source. TEE neg. Loop recorder placed to look for AF. Speech improved in house. No other neuro deficits. OP SLP recommended. Plan D/c home.  Stroke:  R MCA embolic secondary to unknown source  Resultant  Mild word finding difficulty  CT head - no acute abnormality  CTA head and neck - unremarkable  MRI head - L MCA infarct (L insula and frontal operculum). Petechial blood products negative scan.  CD - B ICA 1-39% stenosis, VAs antegrade   2D Echo - EF 55-60%. No source of embolus   LE venous doppler - canceled. TEE neg for PFO  TEE no PFO, EF 50-55%. No source of embolus. Wall motion abnormalities  Concerning for previous MI  EEG - normal  LDL - 187  HgbA1c - 5.5  Hypercoagulable work up negative   RPR  negative   No antithrombotic prior to admission, now on aspirin and plavix x 3 weeks then aspirin alone.  Therapy recommendations:  OP SLP recommended for language. Pt feels he will improve on his own. No PT or OT needs  Disposition:  return home  Hypothyroidism  TSH 10.162  Likely new diagnosis  Add synthroid low dose  Follow up with PCP  Blood Pressure  Stable  SBP 160s on presentation, normalizing now  BP goal normotensive  Hyperlipidemia  Lipid lowering medication PTA:  none  LDL 187, goal < 70  Current lipid lowering medication: Started Lipitor 80 mg daily  Continue statin at discharge  Tobacco abuse  Current smoker  Smoking cessation counseling provided  Pt is willing to quit  Other Stroke Risk Factors  ETOH use, advised to drink no more than 2 drink(s) a day  ? CAD - unknown hx. Wall motion abnormalities  On 2D concerning for previous MI Aortic and coronary atherosclerotic calcification seen on CT chest  Other Active Problems  B12 = 267 - will give  supplement, goal > 500  CXR at [place] with widened mediastinum. CT chest Negative for mediastinal mass. Mediastinal widening on chest x-ray was likely technical.  Numerous cystic densities in the liver.   DISCHARGE EXAM Blood pressure 127/80, pulse 88, temperature 97.8 F (36.6 C), temperature source Oral, resp. rate 18, height 5\' 7"  (1.702 m), weight 90.3 kg, SpO2 94 %. General - Well nourished, well developed, in no apparent distress.  Cardiovascular - Regular rate and rhythm.  Mental Status -  Level of arousal and orientation to time, place, and person were intact. Language exam showed expression with intermittent word finding difficulty, able to name, repetition intact, comprehension intact.  Fund of Knowledge was assessed and was intact.  Cranial Nerves II - XII - II - Visual field intact OU. III, IV, VI - Extraocular movements intact. V - Facial sensation intact bilaterally. VII - Facial movement intact bilaterally. VIII - Hearing & vestibular intact bilaterally. X - Palate elevates symmetrically. XI - Chin turning & shoulder shrug intact bilaterally. XII - Tongue protrusion intact.  Motor Strength - The patient's strength was normal in all extremities and pronator drift was absent.  Bulk was normal and fasciculations were absent.   Motor Tone - Muscle tone was assessed at the neck and appendages and was normal.  Sensory - Light touch, temperature/pinprick were assessed and were symmetrical.    Coordination - The patient had normal movements in the hands and feet with no ataxia or dysmetria.  Tremor was absent.  Gait and Station - deferred.  Discharge Diet   Heart healthy thin liquids  DISCHARGE PLAN  Disposition:  Home  aspirin 81 mg daily and clopidogrel 75 mg daily x 3 weeks then aspirin alone for secondary stroke prevention.  Ongoing risk factor control by Primary Care Physician at time of discharge  Follow-up PCP 2 weeks. Patient instructed to get  one.   Consider cardiology follow up for ? Hx MI, coronary atherosclerosis   Address cystic liver lesions  Follow-up in [facility] in 4 weeks, office to schedule an appointment.   45 minutes were spent preparing discharge.  Annie MainSharon Biby, MSN, APRN, ANVP-BC, AGPCNP-BC Advanced Practice Stroke Nurse [facility] See Amion for Schedule & Pager information 07/11/2018 11:45 AM    ATTENDING NOTE: I reviewed above note and agree with the assessment and plan. I have made any additions or clarifications directly to the above  note.   No acute event overnight. Had chest CT showed no acute abnormality and abnormal CXR likely artifact. His hypercoagulable labs were negative. He needs to establish PCP and also likely needs cardiology follow up as outpt. Also needs follow up with PCP for hypothyroidism, will d/c with low dose synthroid. Continue DAPT for 3 weeks and then ASA alone. Continue high dose statin. Recommend outpt speech. Ready for discharge. Will follow up with [facility] stroke clinic in 4 weeks.   Marvel PlanJindong Daekwon Beswick, MD PhD Stroke Neurology 07/11/2018 6:43 PM

## 2018-07-10 NOTE — CV Procedure (Signed)
Procedure: TEE  Indication: CVA  Sedation: Versed 5 mg IV, Fentanyl 50 mcg IV  Findings: Please see echo section for full report.  Normal LV size with mild focal basal septal hypertrophy.  EF 50-55% with mid to apical anteroseptal and apical anterior severe hypokinesis.  Normal RV size and systolic function.   Normal right atrial size.  Normal left atrial size, no LA appendage thrombus.  Trivial TR.  Trivial MR.  Trileaflet aortic valve with no stenosis or regurgitation.  Normal caliber thoracic aorta with grade 3 plaque in the descending thoracic aorta.   Impression: No source of embolism.  Wall motion abnormalities concerning for prior MI.   Jordan Mendoza 07/10/2018 11:19 AM

## 2018-07-10 NOTE — Progress Notes (Signed)
Physical Therapy Treatment Patient Details Name: Jordan Mendoza MRN: [id number] DOB: [birth year] Today's Date: 07/10/2018    History of Present Illness [name] is a 57 y.o. male with no significant PMHX but no regular medical follow up presenting with speech difficulties.  S/p TPA at [facility].    PT Comments    Patient is making progress toward PT goals. Pt with DGI score of 22/24. Continue to progress as tolerated. Current plan remains appropriate.    Follow Up Recommendations  No PT follow up     Equipment Recommendations  None recommended by PT    Recommendations for Other Services       Precautions / Restrictions Precautions Precautions: Fall    Mobility  Bed Mobility Overal bed mobility: Modified Independent Bed Mobility: Supine to Sit;Sit to Supine     Supine to sit: Supervision        Transfers Overall transfer level: Modified independent Equipment used: None Transfers: Sit to/from Stand Sit to Stand: Supervision            Ambulation/Gait Ambulation/Gait assistance: Supervision Gait Distance (Feet): 300 Feet Assistive device: None Gait Pattern/deviations: Step-through pattern;Decreased stride length Gait velocity: decreased(able to change gait speed without difficulty )   General Gait Details: cues for increased cadence and stride length   Stairs             Wheelchair Mobility    Modified Rankin (Stroke Patients Only) Modified Rankin (Stroke Patients Only) Pre-Morbid Rankin Score: No symptoms Modified Rankin: Moderate disability     Balance Overall balance assessment: Needs assistance   Sitting balance-Leahy Scale: Good       Standing balance-Leahy Scale: Good                   Standardized Balance Assessment Standardized Balance Assessment : Dynamic Gait Index   Dynamic Gait Index Level Surface: Normal Change in Gait Speed: Normal Gait with Horizontal Head Turns: Normal Gait with Vertical Head  Turns: Mild Impairment Gait and Pivot Turn: Normal Step Over Obstacle: Mild Impairment Step Around Obstacles: Normal Steps: Normal Total Score: 22      Cognition Arousal/Alertness: Awake/alert Behavior During Therapy: WFL for tasks assessed/performed Overall Cognitive Status: Within Functional Limits for tasks assessed                               Problem Solving: Slow processing General Comments: little slowed, but oriented x 4      Exercises      General Comments        Pertinent Vitals/Pain Pain Assessment: No/denies pain    Home Living Family/patient expects to be discharged to:: Private residence Living Arrangements: Spouse/significant other Available Help at Discharge: Available 24 hours/day;Friend(s) Type of Home: House Home Access: Stairs to enter   Home Layout: One level Home Equipment: None      Prior Function Level of Independence: Independent      Comments: worked on cars   PT Goals (current goals can now be found in the care plan section) Acute Rehab PT Goals Patient Stated Goal: to return to normal PT Goal Formulation: With patient/family Time For Goal Achievement: 07/16/18 Potential to Achieve Goals: Good Progress towards PT goals: Progressing toward goals    Frequency    Min 4X/week      PT Plan Current plan remains appropriate    Co-evaluation  AM-PAC PT "6 Clicks" Daily Activity  Outcome Measure  Difficulty turning over in bed (including adjusting bedclothes, sheets and blankets)?: None Difficulty moving from lying on back to sitting on the side of the bed? : None Difficulty sitting down on and standing up from a chair with arms (e.g., wheelchair, bedside commode, etc,.)?: None Help needed moving to and from a bed to chair (including a wheelchair)?: None Help needed walking in hospital room?: A Little Help needed climbing 3-5 steps with a railing? : A Little 6 Click Score: 22    End of Session  Equipment Utilized During Treatment: Gait belt Activity Tolerance: Patient tolerated treatment well Patient left: with call bell/phone within reach;with family/visitor present;in bed Nurse Communication: Mobility status PT Visit Diagnosis: Other abnormalities of gait and mobility (R26.89)     Time: [id number] PT Time Calculation (min) (ACUTE ONLY): 19 min  Charges:  $Gait Training: 8-22 mins                     Erline LevineKellyn Erdine Hulen, PTA Pager: [phone]     Carolynne EdouardKellyn R Alyha Marines 07/10/2018, 9:41 AM

## 2018-07-10 NOTE — Evaluation (Signed)
Occupational Therapy Evaluation and Discharge Patient Details Name: Jordan Peltimothy Kolenda MRN: [id number] DOB: Jan 23, 1961 Today's Date: 07/10/2018    History of Present Illness [name] is a 57 y.o. male with no significant PMHX but no regular medical follow up presenting with speech difficulties.  S/p TPA at [facility].   Clinical Impression   Pt presents to OT with mild balance impairments impacting ability to complete ADLs at baseline due to general immobility and imbalance.  Pt completed functional transfers in bathroom with supervision without use of AD.  Pt able to complete LB dressing tasks without assist.  Pt's family present throughout session and reporting pt at baseline and speech has returned to baseline.  Pt will not require any further OT intervention at this time.    Follow Up Recommendations  No OT follow up    Equipment Recommendations  None recommended by OT    Recommendations for Other Services       Precautions / Restrictions Precautions Precautions: Fall      Mobility Bed Mobility Overal bed mobility: Modified Independent Bed Mobility: Supine to Sit;Sit to Supine     Supine to sit: Supervision        Transfers Overall transfer level: Modified independent Equipment used: None Transfers: Sit to/from Stand Sit to Stand: Supervision              Balance Overall balance assessment: Needs assistance   Sitting balance-Leahy Scale: Good       Standing balance-Leahy Scale: Good                   Standardized Balance Assessment Standardized Balance Assessment : Dynamic Gait Index   Dynamic Gait Index Level Surface: Normal Change in Gait Speed: Normal Gait with Horizontal Head Turns: Normal Gait with Vertical Head Turns: Mild Impairment Gait and Pivot Turn: Normal Step Over Obstacle: Mild Impairment Step Around Obstacles: Normal Steps: Normal Total Score: 22     ADL either performed or assessed with clinical judgement    ADL Overall ADL's : Needs assistance/impaired                     Lower Body Dressing: Independent;Sitting/lateral leans   Toilet Transfer: Supervision/safety       Tub/ Shower Transfer: Therapist, sportsupervision/safety Tub/Shower Transfer Details (indicate cue type and reason): stepped over ledge in room shower, pt reports having walk-in shower similar to hospital shower at home Functional mobility during ADLs: Supervision/safety General ADL Comments: Completed toilet transfers and walk-in shower transfer with supervision.  LB dressing with donning socks in sitting at Independent level.  Ambulated 150' without AD with supervision, somewhat slow but without LOB.     Vision Baseline Vision/History: (reports he should wear glasses but does not) Patient Visual Report: No change from baseline Vision Assessment?: No apparent visual deficits            Pertinent Vitals/Pain Pain Assessment: No/denies pain     Hand Dominance Left   Extremity/Trunk Assessment Upper Extremity Assessment Upper Extremity Assessment: Overall WFL for tasks assessed   Lower Extremity Assessment Lower Extremity Assessment: Overall WFL for tasks assessed       Communication Communication Communication: Expressive difficulties(improving)   Cognition Arousal/Alertness: Awake/alert Behavior During Therapy: WFL for tasks assessed/performed Overall Cognitive Status: Within Functional Limits for tasks assessed                               Problem Solving:  Slow processing General Comments: little slowed, but oriented x 4              Home Living Family/patient expects to be discharged to:: Private residence Living Arrangements: Spouse/significant other Available Help at Discharge: Available 24 hours/day;Friend(s) Type of Home: House Home Access: Stairs to enter Enter[employer] CorporationEntrance Stairs-Number of Steps: 2-3   Home Layout: One level     Bathroom Shower/Tub: Associate ProfessorWalk-in shower         Home  Equipment: None          Prior Functioning/Environment Level of Independence: Independent        Comments: worked on Community education officercars        OT Problem List: Decreased activity tolerance;Decreased safety awareness         OT Goals(Current goals can be found in the care plan section) Acute Rehab OT Goals Patient Stated Goal: to return to normal OT Goal Formulation: All assessment and education complete, DC therapy   AM-PAC PT "6 Clicks" Daily Activity     Outcome Measure Help from another person eating meals?: None Help from another person taking care of personal grooming?: None Help from another person toileting, which includes using toliet, bedpan, or urinal?: A Little Help from another person bathing (including washing, rinsing, drying)?: A Little Help from another person to put on and taking off regular upper body clothing?: A Little Help from another person to put on and taking off regular lower body clothing?: A Little 6 Click Score: 20   End of Session Equipment Utilized During Treatment: Gait belt Nurse Communication: Mobility status  Activity Tolerance: Patient tolerated treatment well Patient left: in chair;with call bell/phone within reach;with family/visitor present  OT Visit Diagnosis: Unsteadiness on feet (R26.81);Other abnormalities of gait and mobility (R26.89)                Time: [id number] OT Time Calculation (min): 18 min Charges:  OT General Charges $OT Visit: 1 Visit OT Evaluation $OT Eval Low Complexity: 1 Low  Judie Hollick, [phone] 07/10/2018, 9:50 AM

## 2018-07-10 NOTE — Progress Notes (Signed)
SLP Cancellation Note  Patient Details Name: Jordan Mendoza MRN: [id number] DOB: 12-20-1960   Cancelled treatment:      Attempted to see pt for speech language evaluation.  Spoke with family. Pt was off floor for TEE at time of attempt.  Will reattempt as SLP schedule permits.                                                                                              Jordan Mendoza 07/10/2018, 10:45 AM

## 2018-07-10 NOTE — Evaluation (Signed)
Speech Language Pathology Evaluation Patient Details Name: Jordan Mendoza MRN: [id number] DOB: 12/12/1960 Today's Date: 07/10/2018 Time: [id number] SLP Time Calculation (min) (ACUTE ONLY): 26 min  Problem List:  Patient Active Problem List   Diagnosis Date Noted  . 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 Smokes 1/2 pack a day or less 1990   started thirty years ago   Past Surgical History:  Past Surgical History:  Procedure Laterality Date  . LOOP RECORDER INSERTION N/A 07/10/2018   Procedure: LOOP RECORDER INSERTION;  Surgeon: Marinus Mawaylor, Gregg W, MD;  [facility];  Service: Cardiovascular;  Laterality: N/A;   HPI:  Jordan Peltimothy Kneebone is a 57 y.o. male with no significant past medical history who [facility] as a stroke alert for aphasia, received TPA and [facility].  MRI revealed "Findings consistent with a left MCA branch vessel infarction with a -3 cm region of acute infarction in the left deep insula and frontal operculum"   Assessment / Plan / Recommendation Clinical Impression  Pt presents with mild expressive and receptive language deficits.  Pt exhibited word finding difficulty with semantic and phonemic paraphasias ("shoehorn" for "horseshoe", "squid" for "octopus").  Pt was typically aware of errors ("is that right?") and self corrected in 1 of 2 opportunities. Pt demonstrated ability to use circumlocution and describe target word, but did not benefit from phonemic cue x1.  Pt exhibited errors with command following, mixing up instructions; for example, pointing to an item he was instructed to pick up and picking up item he was supposed to point to.  Pt's speech was clear and free from dysarthria.  Cognition was assessed using the COGNISTAT (see below for scores).  Pt performed within average range on most tasks. Deficits in  language tasks are described above. Pt exhibited some difficulty with calculations.  Pt reports that this is his baseline. Pt reports that he does not require much math during his daily work and does not feel this will impact is ability to perform his job or manage his finances.  Pt performed within average range on delayed recall task.  Registration, however, required 7 trials and pt reports feeling that he is having difficulty with memory.  Pt would benefit from speech therapy to address deficits noted above and will likely need to continue treatment at next level of care.  COGNISTAT - All subtests are within the average range except where otherwise indicated Orientation: 12/12 Attention: 6/8 Comprehension: 4/6, Mild impairment Repetition: 11/12 Naming: 6/8, Borderline impairment Construction: not assessed Memory: 11/12 Calculation: 2/4, Mild impairment Similarities: 7/8 Judgment: 5/6    SLP Assessment  SLP Recommendation/Assessment: Patient needs continued Speech Lanaguage Pathology Services SLP Visit Diagnosis: Aphasia (R47.01);Cognitive communication deficit (R41.841)    Follow Up Recommendations  Home health SLP;Outpatient SLP    Frequency and Duration min 1 x/week  2 weeks      SLP Evaluation Cognition  Overall Cognitive Status: Impaired/Different from baseline Orientation Level: Oriented X4 Attention: Focused;Sustained Focused Attention: Appears intact Sustained Attention: Appears intact Memory: Impaired Memory Impairment: Storage deficit Problem Solving: Appears intact Executive Function: Reasoning Reasoning: Appears intact       Comprehension  Auditory Comprehension Overall Auditory Comprehension: Impaired Commands: Impaired Multistep Basic Commands: 50-74% accurate    Expression Expression Primary Mode of Expression: Verbal Verbal Expression Overall Verbal Expression: Impaired Repetition: No impairment Naming: Impairment Confrontation: Impaired Verbal  Errors:  Semantic paraphasias;Phonemic paraphasias   Oral / Motor      GO                    Opie Fanton E Damarie Schoolfield, MA, CCC-SLP.  Pager [phone] 07/10/2018, 3:31 PM

## 2018-07-10 NOTE — Progress Notes (Addendum)
STROKE TEAM PROGRESS NOTE   SUBJECTIVE (INTERVAL HISTORY) His girlfriend is at the bedside. TEE neg. Loop in place. Planned d/c on hold d/t CT chest needed. Will d/c in am.   OBJECTIVE Vitals:   07/10/18 1050 07/10/18 1125 07/10/18 1200 07/10/18 1311  BP: 126/61 140/76 118/66 132/78  Pulse: 73 90 71 77  Resp: 13 13 17 16   Temp:    98 F (36.7 C)  TempSrc:    Oral  SpO2: 98% 94% 95% 98%  Weight:      Height:        CBC:  Recent Labs  Lab 07/09/18 0715 07/10/18 0316  WBC 7.9 8.2  HGB 14.6 13.8  HCT 43.4 41.2  MCV 89.7 90.4  PLT 261 244    Basic Metabolic Panel:  Recent Labs  Lab 07/09/18 0715 07/10/18 0316  NA 140 141  K 4.1 4.1  CL 103 104  CO2 29 28  GLUCOSE 105* 97  BUN 12 14  CREATININE 1.08 1.11  CALCIUM 9.2 9.1    Lipid Panel:     Component Value Date/Time   CHOL 279 (H) 07/09/2018 0228   TRIG 308 (H) 07/09/2018 0228   HDL 30 (L) 07/09/2018 0228   CHOLHDL 9.3 07/09/2018 0228   VLDL 62 (H) 07/09/2018 0228   LDLCALC 187 (H) 07/09/2018 0228   HgbA1c:  Lab Results  Component Value Date   HGBA1C 5.5 07/09/2018   Urine Drug Screen: No results found for: LABOPIA, COCAINSCRNUR, LABBENZ, AMPHETMU, THCU, LABBARB  Alcohol Level No results found for: ETH  IMAGING CT head in [place] - no acute abnormality  CTA head and neck in [place] - unremarkable  MRI Brain Wo Contrast -  Findings consistent with a left MCA branch vessel infarction with a 2-3 cm region of acute infarction in the left deep insula and frontal operculum. Mild swelling. Petechial blood products without frank hematoma. No significant mass effect or shift. Otherwise negative scan.  Carotid Doppler Antegrade flow bilateral vertebral arteries.  1-39% stenosis bilateral ICAs.  Intimal thickening bilateral CCAs  Transthoracic Echocardiogram  - Left ventricle: The cavity size was normal. Wall thickness wasnormal. The estimated ejection fraction was in the range of 55%to 60%. Hypokinesis  of the mid and apical anteroseptal andanterior myocardium. Doppler parameters are consistent withabnormal left ventricular relaxation (grade 1 diastolicdysfunction).  TEE Normal LV size with mild focal basal septal hypertrophy. EF 50-55% with mid to apical anteroseptal and apical anterior severe hypokinesis. Normal RV size and systolic function. Normal right atrial size. Normal left atrial size, no LA appendage thrombus. Trivial TR. Trivial MR. Trileaflet aortic valve with no stenosis or regurgitation. Normal caliber thoracic aorta with grade 3 plaque in the descending thoracic aorta.  Impression: No source of embolism. Wall motion abnormalities concerning for prior MI.   EEG This is a normal awake electroencephalogram. There are no focal lateralizing or epileptiform features.  CT chest pending    PHYSICAL EXAM General - Well nourished, well developed, in no apparent distress.  Cardiovascular - Regular rate and rhythm.  Mental Status -  Level of arousal and orientation to time, place, and person were intact. Language exam showed expression with intermittent word finding difficulty, able to name, repetition intact, comprehension intact.  Fund of Knowledge was assessed and was intact.  Cranial Nerves II - XII - II - Visual field intact OU. III, IV, VI - Extraocular movements intact. V - Facial sensation intact bilaterally. VII - Facial movement intact bilaterally. VIII - Hearing &  vestibular intact bilaterally. X - Palate elevates symmetrically. XI - Chin turning & shoulder shrug intact bilaterally. XII - Tongue protrusion intact.  Motor Strength - The patient's strength was normal in all extremities and pronator drift was absent.  Bulk was normal and fasciculations were absent.   Motor Tone - Muscle tone was assessed at the neck and appendages and was normal.  Sensory - Light touch, temperature/pinprick were assessed and were symmetrical.    Coordination - The  patient had normal movements in the hands and feet with no ataxia or dysmetria.  Tremor was absent.  Gait and Station - deferred.   ASSESSMENT/PLAN Mr. Pete Peltimothy Massimino is a 57 y.o. male with no significant PMHX but no regular medical follow uppresenting with speech difficulties.[facility]. [facility] Found to have L MCA infarcts, embolic from unknown source. TEE neg. Loop recorder placed to look for AF. Speech improved in house. No other neuro deficits. OP SLP recommended.  Stroke - left MCA infarct, embolic pattern, source unclear  Resultant  Mild word finding difficulty  CT head - no acute abnormality  CTA head and neck - unremarkable  MRI head -L MCA infarct (L insula and frontal operculum). Petechial blood products negative scan.  2D Echo -EF 50-55%. No source of embolus.   TEE normal EF, but wall motion abnormalities concerning for previous MI  CUS - B ICA 1-39% stenosis, VAs antegrade   LE venous doppler - canceled. TEE neg for PFO  EEG - normal  LDL - 187  HgbA1c - 5.5  UDS - ordered. Not collected  RPR negative   VTE prophylaxis - SCDs  Diet - Heart Healthy with thin liquids  No antithrombotic prior to admission, Given mild stroke, will place on aspirin 81 mg and plavix 75 mg daily x 3 weeks, then aspirin alone. Orders adjusted.   Therapy recommendations:  OP SLP language. Pt feels he will improve on his own  Disposition:  Return home  Hypothyroidism  TSH 10.162  Likely new diagnosis  Add synthroid low dose  Follow up with PCP  Hypertension  Stable  SBP 160s on presentation . Permissive hypertension (OK if < 180/105) but gradually normalize in 5-7 days . Long-term BP goal normotensive  Hyperlipidemia  Lipid lowering medication PTA:  none  LDL 187, goal < 70  Current lipid lowering medication: Start Lipitor 80 mg daily  Continue statin at discharge  Tobacco abuse  Current smoker  Smoking  cessation counseling provided  Pt is willing to quit  Other Stroke Risk Factors  ETOH use, advised to drink no more than 2 drink(s) a day  Other Active Problems  B12 = 267 - will give supplement, goal > 500  CXR at [facility] with hilar lymphadenopathy. CT chest pending   Hospital day # 2  Annie MainSharon Biby, MSN, APRN, ANVP-BC, AGPCNP-BC Advanced Practice Stroke Nurse [facility] See Amion for Schedule & Pager information 07/10/2018 5:27 PM   ATTENDING NOTE: I reviewed above note and agree with the assessment and plan. I have made any additions or clarifications directly to the above note. Pt was seen and examined.   Patient wife and 2 daughters are at bedside.  Patient lying in bed having lunch.  Speech continues to improve.  Had a TEE today, Unremarkable, EF 50 to 55%, no thrombus or PFO, however there was apical anterior wall motion abnormality concerning for prior MI. Loop recorder placed.  UDS pending.  Given chest x-ray at  the outside hospital showing hilar enlargement, will check chest CT.  Continue DAPT for 3 weeks and then aspirin alone.  PT/OT/speech recommend outpatient speech.  Marvel PlanJindong Brek Reece, MD PhD Stroke Neurology 07/10/2018 6:00 PM   To contact Stroke Continuity provider, please refer to [email or web address]. After hours, contact General Neurology

## 2018-07-11 ENCOUNTER — Inpatient Hospital Stay (HOSPITAL_COMMUNITY): Payer: Self-pay

## 2018-07-11 DIAGNOSIS — E039 Hypothyroidism, unspecified: Secondary | ICD-10-CM

## 2018-07-11 DIAGNOSIS — E785 Hyperlipidemia, unspecified: Secondary | ICD-10-CM

## 2018-07-11 DIAGNOSIS — I2510 Atherosclerotic heart disease of native coronary artery without angina pectoris: Secondary | ICD-10-CM | POA: Diagnosis present

## 2018-07-11 DIAGNOSIS — F17200 Nicotine dependence, unspecified, uncomplicated: Secondary | ICD-10-CM

## 2018-07-11 DIAGNOSIS — E538 Deficiency of other specified B group vitamins: Secondary | ICD-10-CM

## 2018-07-11 DIAGNOSIS — I63411 Cerebral infarction due to embolism of right middle cerebral artery: Principal | ICD-10-CM

## 2018-07-11 DIAGNOSIS — K7689 Other specified diseases of liver: Secondary | ICD-10-CM | POA: Diagnosis present

## 2018-07-11 LAB — BASIC METABOLIC PANEL
ANION GAP: 8 (ref 5–15)
BUN: 15 mg/dL (ref 6–20)
CO2: 27 mmol/L (ref 22–32)
Calcium: 8.9 mg/dL (ref 8.9–10.3)
Chloride: 105 mmol/L (ref 98–111)
Creatinine, Ser: 1.08 mg/dL (ref 0.61–1.24)
GFR calc non Af Amer: >60 mL/min (ref >60)
GLUCOSE: 97 mg/dL (ref 70–99)
POTASSIUM: 3.9 mmol/L (ref 3.5–5.1)
SODIUM: 140 mmol/L (ref 135–145)

## 2018-07-11 LAB — BETA-2-GLYCOPROTEIN I ABS, IGG/M/A: Beta-2 Glyco I IgG: <9 GPI IgG units (ref 0–20)

## 2018-07-11 LAB — CBC
HCT: 41.4 % (ref 39.0–52.0)
HEMOGLOBIN: 13.5 g/dL (ref 13.0–17.0)
MCH: 29.5 pg (ref 26.0–34.0)
MCHC: 32.6 g/dL (ref 30.0–36.0)
MCV: 90.4 fL (ref 78.0–100.0)
PLATELETS: 230 10*3/uL (ref 150–400)
RBC: 4.58 MIL/uL (ref 4.22–5.81)
RDW: 12.5 % (ref 11.5–15.5)
WBC: 8.1 10*3/uL (ref 4.0–10.5)

## 2018-07-11 LAB — CARDIOLIPIN ANTIBODIES, IGG, IGM, IGA
Anticardiolipin IgG: <9 GPL U/mL (ref 0–14)
Anticardiolipin IgM: <9 MPL U/mL (ref 0–12)

## 2018-07-11 LAB — HOMOCYSTEINE: Homocysteine: 14.7 umol/L (ref 0.0–15.0)

## 2018-07-11 LAB — LUPUS ANTICOAGULANT PANEL
DRVVT: 41.7 s (ref 0.0–47.0)
PTT Lupus Anticoagulant: 37.4 s (ref 0.0–51.9)

## 2018-07-11 MED ORDER — IOHEXOL 300 MG/ML  SOLN
80.0000 mL | Freq: Once | INTRAMUSCULAR | Status: AC | PRN
  Administered 2018-07-11: 75 mL via INTRAVENOUS

## 2018-07-11 NOTE — Progress Notes (Signed)
NURSING PROGRESS NOTE  Pete Peltimothy Kreft [id number] Discharge Data: 07/11/2018 2:53 PM Attending Provider: Marvel PlanXu, Jindong, MD WUJ:[id number]:Patient, No Pcp Per     Pete Peltimothy Mckinney to be D/C'd Home per MD order.  Discussed with the patient the After Visit Summary and all questions fully answered. All IV's discontinued with no bleeding noted. All belongings returned to patient for patient to take home.   Last Vital Signs:  Blood pressure 132/83, pulse 77, temperature 97.8 F (36.6 C), temperature source Oral, resp. rate 18, height 5\' 7"  (1.702 m), weight 90.3 kg, SpO2 98 %.  Discharge Medication List Allergies as of 07/11/2018   No Known Allergies     Medication List    TAKE these medications   aspirin 81 MG EC tablet Take 1 tablet (81 mg total) by mouth daily.   atorvastatin 80 MG tablet Commonly known as:  LIPITOR Take 1 tablet (80 mg total) by mouth daily at 6 PM.   clopidogrel 75 MG tablet Commonly known as:  PLAVIX Take 1 tablet (75 mg total) by mouth daily.   cyanocobalamin 1000 MCG tablet Take 1 tablet (1,000 mcg total) by mouth daily.   levothyroxine 25 MCG tablet Commonly known as:  SYNTHROID, LEVOTHROID Take 1 tablet (25 mcg total) by mouth daily before breakfast.

## 2018-07-11 NOTE — Progress Notes (Signed)
Received consult for Outpatient ST; CM talked to patient/ daughter; patient is refusing OP ST at this time; CM informed him that if he changed his mind and wanted Outpatient Therapy after discharge to call his PCP and he can make arrangements from the office. Abelino DerrickB Natasia Sanko [facility] CottageRN,MHA,BSN [phone]

## 2018-07-19 ENCOUNTER — Ambulatory Visit (INDEPENDENT_AMBULATORY_CARE_PROVIDER_SITE_OTHER): Payer: Self-pay | Admitting: *Deleted

## 2018-07-19 DIAGNOSIS — I639 Cerebral infarction, unspecified: Secondary | ICD-10-CM

## 2018-07-19 LAB — CUP PACEART INCLINIC DEVICE CHECK
Date Time Interrogation Session: 20190822202416
MDC IDC PG IMPLANT DT: 20190813

## 2018-07-19 NOTE — Progress Notes (Signed)
Wound check appointment. Steri-strips removed. Wound without redness or edema. Incision edges approximated, wound well healed. Battery status: Good. R-waves 0.36 mV. 0 symptom episodes, 0 tachy episodes, 0 pause episodes, 0 brady episodes. 0 AF episodes (0% burden). Monthly summary reports and ROV with GT PRN.

## 2018-08-13 ENCOUNTER — Ambulatory Visit (INDEPENDENT_AMBULATORY_CARE_PROVIDER_SITE_OTHER): Payer: Self-pay | Admitting: *Deleted

## 2018-08-13 DIAGNOSIS — I639 Cerebral infarction, unspecified: Secondary | ICD-10-CM

## 2018-08-13 NOTE — Progress Notes (Signed)
Carelink Summary Report / Loop Recorder 

## 2018-08-17 ENCOUNTER — Ambulatory Visit: Payer: MEDICAID | Admitting: Adult Health

## 2018-08-26 LAB — CUP PACEART REMOTE DEVICE CHECK
Implantable Pulse Generator Implant Date: 20190813
MDC IDC SESS DTM: 20190915153844

## 2018-09-05 ENCOUNTER — Ambulatory Visit: Payer: MEDICAID | Admitting: Adult Health

## 2018-09-14 ENCOUNTER — Ambulatory Visit (INDEPENDENT_AMBULATORY_CARE_PROVIDER_SITE_OTHER): Payer: Self-pay | Admitting: *Deleted

## 2018-09-14 DIAGNOSIS — I639 Cerebral infarction, unspecified: Secondary | ICD-10-CM

## 2018-09-14 NOTE — Progress Notes (Signed)
Carelink Summary Report / Loop Recorder 

## 2018-10-02 LAB — CUP PACEART REMOTE DEVICE CHECK
Date Time Interrogation Session: 20191018154049
Implantable Pulse Generator Implant Date: 20190813

## 2018-10-03 ENCOUNTER — Other Ambulatory Visit: Payer: Self-pay

## 2018-10-03 ENCOUNTER — Telehealth: Payer: Self-pay | Admitting: Adult Health

## 2018-10-03 DIAGNOSIS — E785 Hyperlipidemia, unspecified: Secondary | ICD-10-CM

## 2018-10-03 MED ORDER — ATORVASTATIN CALCIUM 80 MG PO TABS
80.0000 mg | ORAL_TABLET | Freq: Every day | ORAL | 0 refills | Status: AC
Start: 2018-10-03 — End: ?

## 2018-10-03 MED ORDER — LEVOTHYROXINE SODIUM 25 MCG PO TABS: 25.0000 ug | ORAL_TABLET | Freq: Every day | ORAL | 0 refills | Status: AC

## 2018-10-03 NOTE — Telephone Encounter (Addendum)
Rn spoke with [place] NP and Dr. Pearlean Brownie. Refills will be only done for 30 days until pt sees PCP. Refill done on lipitor and levothyroxine medications.  RN call Morrie Sheldon at [phone] pts daughter. Rn ask daughter if pt has PCP. The daughter stated her father does not have a primary doctor. He will be seeing a PCP next Wednesday DR. Cox to establish care. PT will be seeing [place] next Thursday for a hospital follow up.RN stated once pt sees PCP they can manage the medications ongoing. The daughter verbalized understanding.Refill done for 30 days of lipitor and levothyroxine. PT was only on plavix for 21 days in August 2019.

## 2018-10-03 NOTE — Telephone Encounter (Signed)
Patient's daughter Morrie Sheldon requesting refills for  atorvastatin (LIPITOR) 80 MG tablet, clopidogrel (PLAVIX) 75 MG tablet and levothyroxine (SYNTHROID, LEVOTHROID) 25 MCG tablet [facility] [place]. He was prescribed these medications in the hospital and is almost out. He is scheduled with Shanda Bumps on 10-11-18

## 2018-10-03 NOTE — Telephone Encounter (Signed)
Noted! Thank you

## 2018-10-10 DIAGNOSIS — R5382 Chronic fatigue, unspecified: Secondary | ICD-10-CM | POA: Diagnosis not present

## 2018-10-10 DIAGNOSIS — E785 Hyperlipidemia, unspecified: Secondary | ICD-10-CM | POA: Diagnosis not present

## 2018-10-10 DIAGNOSIS — E538 Deficiency of other specified B group vitamins: Secondary | ICD-10-CM | POA: Diagnosis not present

## 2018-10-10 DIAGNOSIS — I10 Essential (primary) hypertension: Secondary | ICD-10-CM | POA: Diagnosis not present

## 2018-10-10 DIAGNOSIS — E039 Hypothyroidism, unspecified: Secondary | ICD-10-CM | POA: Diagnosis not present

## 2018-10-11 ENCOUNTER — Encounter: Payer: Self-pay | Admitting: Adult Health

## 2018-10-11 ENCOUNTER — Ambulatory Visit: Payer: 59 | Admitting: Adult Health

## 2018-10-11 ENCOUNTER — Other Ambulatory Visit: Payer: Self-pay

## 2018-10-11 VITALS — BP 120/74 | HR 67

## 2018-10-11 DIAGNOSIS — I63411 Cerebral infarction due to embolism of right middle cerebral artery: Secondary | ICD-10-CM | POA: Diagnosis not present

## 2018-10-11 DIAGNOSIS — E785 Hyperlipidemia, unspecified: Secondary | ICD-10-CM | POA: Diagnosis not present

## 2018-10-11 DIAGNOSIS — I10 Essential (primary) hypertension: Secondary | ICD-10-CM | POA: Diagnosis not present

## 2018-10-11 NOTE — Patient Outreach (Signed)
First attempt to obtain mRs. No answer. Left message for return call. No DPR on file.  

## 2018-10-11 NOTE — Progress Notes (Signed)
[facility] [address] [place]. [place] [address] [phone]       OFFICE FOLLOW UP NOTE  [name] Date of Birth:  1961/06/30 Medical Record Number:  [id number]   Reason for Referral:  hospital stroke follow up  CHIEF COMPLAINT:  Chief Complaint  Patient presents with  . Follow-up    Stroke follow up from hospital room 9 pt with Ariel his daughter    HPI: Jordan Mendoza is being seen today for initial visit in the office for left MCA infarct secondary to unknown source on 07/08/2018. History obtained from patient, daughter and chart review. Reviewed all radiology images and labs personally.  Mr.Jordan Ambrosiniis a 57 y.o.malewith no significant PMHX but no regular medical follow uppresenting with speech difficulties.tPA given  at [facility] and then transferred to [facility] for further management.  CT head was negative for acute abnormality.  CTA head and neck unremarkable.  MRI brain reviewed and showed left MCA infarct.  Carotid Doppler showed bilateral ICA stenosis 1 to 39%.  2D echo showed an EF of 55 to 60% without cardiac source of embolus identified.  Infarct felt to be embolic secondary to unknown source therefore TEE obtained which was negative for PFO and loop recorder was placed to rule out atrial fibrillation.  TEE did show wall motion abnormalities concerning for previous MI and recommended follow-up with cardiology as outpatient.  LDL 187 and A1c 5.5.  Hypercoagulable work-up negative.  Recommended DAPT for 3 weeks and aspirin alone.  Discharged on atorvastatin 80 mg daily.  Discharged home in stable condition without therapy needs.  Patient is being seen today for hospital follow-up and is accompanied by his daughter.  He states he has been recovering well without residual deficits or recurring of symptoms.  He does endorse fatigue but this has been improving.  He has been having difficulty with sleeping during the night as he  awakens every couple of hours.  He does endorse snoring along with daytime fatigue prior to his stroke.  He denies any prior work-up for OSA.  He has returned to work full-time without complications.  Continues to take aspirin without side effects of bleeding or bruising as he is completed 3 weeks DAPT.  Continues to take atorvastatin 80 mg daily without side effects myalgias.  Blood pressure today satisfactory 120/74.  He recently established care with PCP yesterday who will be placing referral for cardiology follow-up.  No further concerns at this time.  Denies new or worsening stroke/TIA symptoms.  ROS:   14 system review of systems performed and negative with exception of fatigue and frequent waking  PMH:  Past Medical History:  Diagnosis Date  . Hypothyroid 07/10/2018  . Smokes 1/2 pack a day or less 1990   started thirty years ago  . 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 LOOP RECORDER INSERTION N/A 07/10/2018   Procedure: LOOP RECORDER INSERTION;  Surgeon: Marinus Maw, MD;  Location: [facility] INVASIVE CV LAB;  Service: Cardiovascular;  Laterality: N/A;  . TEE WITHOUT CARDIOVERSION N/A 07/10/2018   Procedure: TRANSESOPHAGEAL ECHOCARDIOGRAM (TEE);  Surgeon: Laurey Morale, MD;  Location: [facility] ENDOSCOPY;  Service: Cardiovascular;  Laterality: N/A;    Social History:  Social History   Socioeconomic History  . Marital status: Widowed    Spouse name: Not on file  . Number of children: 3  . Years of education: Not on file  . Highest education  level: Not on file  Occupational History  . Not on file  Social Needs  . Financial resource strain: Not on file  . Food insecurity:    Worry: Not on file    Inability: Not on file  . Transportation needs:    Medical: Not on file    Non-medical: Not on file  Tobacco Use  . Smoking status: Current Every Day Smoker    Packs/day: 2.00    Years: 30.00    Pack years: 60.00    Types:  Cigarettes    Start date: 02/06/1989  . Smokeless tobacco: Never Used  Substance and Sexual Activity  . Alcohol use: Not on file  . Drug use: Never  . Sexual activity: Yes  Lifestyle  . Physical activity:    Days per week: 2 days    Minutes per session: 30 min  . Stress: To some extent  Relationships  . Social connections:    Talks on phone: More than three times a week    Gets together: Not on file    Attends religious service: More than 4 times per year    Active member of club or organization: Not on file    Attends meetings of clubs or organizations: Not on file    Relationship status: Not on file  . Intimate partner violence:    Fear of current or ex partner: No    Emotionally abused: No    Physically abused: No    Forced sexual activity: No  Other Topics Concern  . Not on file  Social History Narrative  . Not on file    Family History: History reviewed. No pertinent family history.  Medications:   Current Outpatient Medications on File Prior to Visit  Medication Sig Dispense Refill  . aspirin EC 81 MG EC tablet Take 1 tablet (81 mg total) by mouth daily.    Marland Kitchen atorvastatin (LIPITOR) 80 MG tablet Take 1 tablet (80 mg total) by mouth daily at 6 PM. 30 tablet 0  . clopidogrel (PLAVIX) 75 MG tablet Take 1 tablet (75 mg total) by mouth daily. 21 tablet 0  . levothyroxine (SYNTHROID, LEVOTHROID) 25 MCG tablet Take 1 tablet (25 mcg total) by mouth daily before breakfast. 30 tablet 0  . vitamin B-12 1000 MCG tablet Take 1 tablet (1,000 mcg total) by mouth daily. 30 tablet 2   No current facility-administered medications on file prior to visit.     Allergies:  No Known Allergies   Physical Exam  Vitals:   10/11/18 1402  BP: 120/74  Pulse: 67   There is no height or weight on file to calculate BMI. No exam data present  General: well developed, well nourished, pleasant middle-aged Caucasian male, seated, in no evident distress Head: head normocephalic and  atraumatic.   Neck: supple with no carotid or supraclavicular bruits Cardiovascular: regular rate and rhythm, no murmurs Musculoskeletal: no deformity Skin:  no rash/petichiae Vascular:  Normal pulses all extremities  Neurologic Exam Mental Status: Awake and fully alert. Oriented to place and time. Recent and remote memory intact. Attention span, concentration and fund of knowledge appropriate. Mood and affect appropriate.  Cranial Nerves: Fundoscopic exam reveals sharp disc margins. Pupils equal, briskly reactive to light. Extraocular movements full without nystagmus. Visual fields full to confrontation. Hearing intact. Facial sensation intact. Face, tongue, palate moves normally and symmetrically.  Motor: Normal bulk and tone. Normal strength in all tested extremity muscles. Sensory.: intact to touch , pinprick , position and vibratory  sensation.  Coordination: Rapid alternating movements normal in all extremities. Finger-to-nose and heel-to-shin performed accurately bilaterally. Gait and Station: Arises from chair without difficulty. Stance is normal. Gait demonstrates normal stride length and balance. Able to heel, toe and tandem walk without difficulty.  Reflexes: 1+ and symmetric. Toes downgoing.    NIHSS  0 Modified Rankin  0    Diagnostic Data (Labs, Imaging, Testing)  CT head in [place] - no acute abnormality  CTA head and neck in [place] - unremarkable  MRI Brain Wo Contrast Findings consistent with a left MCA branch vessel infarction with a 2-3 cm region of acute infarction in the left deep insula and frontal operculum. Mild swelling. Petechial blood products without frank hematoma. No significant mass effect or shift. Otherwise negative scan.  Transthoracic Echocardiogram  - Left ventricle: The cavity size was normal. Wall thickness wasnormal. The estimated ejection fraction was in the range of 55%to 60%. Hypokinesis of the mid and apical anteroseptal andanterior  myocardium. Doppler parameters are consistent withabnormal left ventricular relaxation (grade 1 diastolicdysfunction).  TEE Normal LV size with mild focal basal septal hypertrophy. EF 50-55% with mid to apical anteroseptal and apical anterior severe hypokinesis. Normal RV size and systolic function. Normal right atrial size. Normal left atrial size, no LA appendage thrombus. Trivial TR. Trivial MR. Trileaflet aortic valve with no stenosis or regurgitation. Normal caliber thoracic aorta with grade 3 plaque in the descending thoracic aorta.  Impression: No source of embolism. Wall motion abnormalities concerning for prior MI.   Carotid Doppler Antegrade flow bilateral vertebral arteries. 1-39% stenosis bilateral ICAs. Intimal thickening bilateral CCAs  EEG This is a normalawakeelectroencephalogram. There are no focal lateralizing or epileptiform features.  CT chest 07/11/2018 1. Negative for mediastinal mass. Mediastinal widening on chest x-ray was likely technical. 2. Aortic and coronary atherosclerotic calcification. No embolic source seen in the chest in this patient with acute stroke. 3. Numerous cystic densities in the liver.   ASSESSMENT: Jordan Mendoza is a 57 y.o. year old male here with left MCA infarct on 07/08/2018 secondary to unknown source therefore loop recorder placed to rule out atrial fibrillation as potential cause. Vascular risk factors include HTN, HLD and tobacco use    PLAN:  1. Left MCA infarct: Continue aspirin 81 mg daily  and atorvastatin 80 mg for secondary stroke prevention. Maintain strict control of hypertension with blood pressure goal below 130/90, diabetes with hemoglobin A1c goal below 6.5% and cholesterol with LDL cholesterol (bad cholesterol) goal below 70 mg/dL.  I also advised the patient to eat a healthy diet with plenty of whole grains, cereals, fruits and vegetables, exercise regularly with at least 30 minutes of continuous  activity daily and maintain ideal body weight. 2. HTN: Advised to continue current treatment regimen.  Today's BP 120/74.  Advised to continue to monitor at home along with continued follow-up with PCP for management 3. HLD: Advised to continue current treatment regimen along with continued follow-up with PCP for future prescribing and monitoring of lipid panel 4. OSA work-up: Patient declining referral at this time but will call us when he would like to schedule appointment 5. Abnormal finding on TEE: Advised him to ensure PCP places referral to cardiology for follow-up as recommended    Follow up in 3 months or call earlier if needed   Greater than 50% of time during this 25 minute visit was spent on counseling, explanation of diagnosis of left MCA infarct, reviewing risk factor management of HTN, HLD and possible OSA,  planning of further management along with potential future management, and discussion with patient and family answering all questions.    George HughJessica , AGNP-BC  [facility] [address] [place], [place] [id number]  Phone [phone] Fax [phone] Note: This document was prepared with digital dictation and possible smart phrase technology. Any transcriptional errors that result from this process are unintentional.

## 2018-10-11 NOTE — Patient Instructions (Addendum)
Continue aspirin 81 mg daily  and lipitor 80mg   for secondary stroke prevention  Continue to follow up with PCP regarding cholesterol and blood pressure management   Continue to stay active and maintain a healthy diet  Follow up with cardiology as recommended after hospital discharge   Please consider to undergo sleep apnea testing - please call office if you would like to meet with one our sleep providers   Continue to monitor blood pressure at home  Maintain strict control of hypertension with blood pressure goal below 130/90, diabetes with hemoglobin A1c goal below 6.5% and cholesterol with LDL cholesterol (bad cholesterol) goal below 70 mg/dL. I also advised the patient to eat a healthy diet with plenty of whole grains, cereals, fruits and vegetables, exercise regularly and maintain ideal body weight.  Followup in the future with me in 3 months or call earlier if needed       Thank you for [facility] at [facility]. I hope we have been able to provide you high quality care today.  You may receive a patient satisfaction survey over the next few weeks. We would appreciate your feedback and comments so that we may continue to improve ourselves and the health of our patients.

## 2018-10-13 NOTE — Progress Notes (Signed)
I agree with the above plan 

## 2018-10-16 ENCOUNTER — Other Ambulatory Visit: Payer: Self-pay

## 2018-10-16 NOTE — Patient Outreach (Signed)
Second attempt to obtain mRs. Patient was driving and could not get good service. Will call back at a more convenient time.

## 2018-10-17 ENCOUNTER — Ambulatory Visit (INDEPENDENT_AMBULATORY_CARE_PROVIDER_SITE_OTHER): Payer: 59

## 2018-10-17 DIAGNOSIS — I639 Cerebral infarction, unspecified: Secondary | ICD-10-CM

## 2018-10-18 NOTE — Progress Notes (Signed)
Carelink Summary Report / Loop Recorder 

## 2018-10-24 ENCOUNTER — Other Ambulatory Visit: Payer: Self-pay

## 2018-10-24 NOTE — Patient Outreach (Signed)
3 outreach attempts were completed to obtain mRs. mRs could not be obtained because patient never returned my calls. mRs=7 

## 2018-10-26 ENCOUNTER — Other Ambulatory Visit: Payer: Self-pay | Admitting: Adult Health

## 2018-10-26 DIAGNOSIS — E785 Hyperlipidemia, unspecified: Secondary | ICD-10-CM

## 2018-10-30 NOTE — Telephone Encounter (Signed)
Left vm for patients daughter Morrie Sheldonshley that two refills were sent to our office. PT was only given a 30 day refill until pt saw his PCP two weeks ago. The PCP was schedule to manage both medications .

## 2018-10-30 NOTE — Telephone Encounter (Signed)
LEft 2nd vm for patients daughter Morrie Sheldonshley to call back about synthroid medication and lipitor meds to be manage by Dr .Nedra HaiLee his PCP.

## 2018-10-31 NOTE — Telephone Encounter (Signed)
[facility]n [facility] and explain to pharmacy tech that pts primary is to manage both medications. Rn gave them Dr. Simone CuriaKEung Lee name. The RN was on hold for 5 minutes. No one never came back to phone to address the call. Rn will call back tomorrow.

## 2018-11-01 NOTE — Telephone Encounter (Signed)
RN call Jordan Mendoza pts daughter who manages pts medications and appts. She was at last visit with pt. Rn stated at last visit it was discuss that [name] his primary doctor manage the liptor and thyroid medication. Rn stated it was a temporary refill until he saw his PCP. Jordan Mendoza stated they seen [name] one time, and her father will be seeing him again this month. She will call [name] office tomorrow about the thyroid medication, and cholesterol medication. Jordan Mendoza verbalized understanding.

## 2018-11-05 ENCOUNTER — Other Ambulatory Visit: Payer: Self-pay | Admitting: Adult Health

## 2018-11-05 DIAGNOSIS — E785 Hyperlipidemia, unspecified: Secondary | ICD-10-CM

## 2018-11-06 NOTE — Telephone Encounter (Signed)
Rn call listed pharmacy and spoke with Clifton Custardaron that  pts daughter was call that PCP is to manage both medications. Clifton Custardaron stated the two medications were sent to primary Dr. Simone CuriaKeung Lee for refill management. Rn stated the refill was sent back to our office. Clifton Custardaron stated they have contacted the PCP office.

## 2018-11-10 DIAGNOSIS — I2510 Atherosclerotic heart disease of native coronary artery without angina pectoris: Secondary | ICD-10-CM | POA: Diagnosis not present

## 2018-11-10 DIAGNOSIS — I10 Essential (primary) hypertension: Secondary | ICD-10-CM | POA: Diagnosis not present

## 2018-11-10 DIAGNOSIS — E538 Deficiency of other specified B group vitamins: Secondary | ICD-10-CM | POA: Diagnosis not present

## 2018-11-19 ENCOUNTER — Ambulatory Visit (INDEPENDENT_AMBULATORY_CARE_PROVIDER_SITE_OTHER): Payer: 59

## 2018-11-19 DIAGNOSIS — I639 Cerebral infarction, unspecified: Secondary | ICD-10-CM | POA: Diagnosis not present

## 2018-11-19 LAB — CUP PACEART REMOTE DEVICE CHECK
Date Time Interrogation Session: 20191223144649
MDC IDC PG IMPLANT DT: 20190813

## 2018-11-20 NOTE — Progress Notes (Signed)
Carelink Summary Report / Loop Recorder 

## 2018-12-02 LAB — CUP PACEART REMOTE DEVICE CHECK
Implantable Pulse Generator Implant Date: 20190813
MDC IDC SESS DTM: 20191120163920

## 2018-12-24 ENCOUNTER — Ambulatory Visit (INDEPENDENT_AMBULATORY_CARE_PROVIDER_SITE_OTHER): Payer: 59

## 2018-12-24 DIAGNOSIS — I639 Cerebral infarction, unspecified: Secondary | ICD-10-CM

## 2018-12-25 LAB — CUP PACEART REMOTE DEVICE CHECK
Implantable Pulse Generator Implant Date: 20190813
MDC IDC SESS DTM: 20200125174117

## 2018-12-25 NOTE — Progress Notes (Signed)
Carelink Summary Report / Loop Recorder 

## 2019-01-12 DIAGNOSIS — E785 Hyperlipidemia, unspecified: Secondary | ICD-10-CM | POA: Diagnosis not present

## 2019-01-12 DIAGNOSIS — E538 Deficiency of other specified B group vitamins: Secondary | ICD-10-CM | POA: Diagnosis not present

## 2019-01-12 DIAGNOSIS — R5382 Chronic fatigue, unspecified: Secondary | ICD-10-CM | POA: Diagnosis not present

## 2019-01-12 DIAGNOSIS — J069 Acute upper respiratory infection, unspecified: Secondary | ICD-10-CM | POA: Diagnosis not present

## 2019-01-12 DIAGNOSIS — I2510 Atherosclerotic heart disease of native coronary artery without angina pectoris: Secondary | ICD-10-CM | POA: Diagnosis not present

## 2019-01-15 ENCOUNTER — Ambulatory Visit: Payer: 59 | Admitting: Adult Health

## 2019-01-15 ENCOUNTER — Encounter: Payer: Self-pay | Admitting: Adult Health

## 2019-01-15 VITALS — BP 125/80 | HR 84 | Wt 196.0 lb

## 2019-01-15 DIAGNOSIS — E785 Hyperlipidemia, unspecified: Secondary | ICD-10-CM | POA: Diagnosis not present

## 2019-01-15 DIAGNOSIS — I10 Essential (primary) hypertension: Secondary | ICD-10-CM

## 2019-01-15 DIAGNOSIS — I63411 Cerebral infarction due to embolism of right middle cerebral artery: Secondary | ICD-10-CM | POA: Diagnosis not present

## 2019-01-15 NOTE — Patient Instructions (Signed)
Continue aspirin 81 mg daily  and Lipitor for secondary stroke prevention  Continue to follow up with PCP regarding cholesterol and blood pressure management   We will continue to monitor loop recorder for atrial fibrillation  Continue to monitor blood pressure at home  Maintain strict control of hypertension with blood pressure goal below 130/90, diabetes with hemoglobin A1c goal below 6.5% and cholesterol with LDL cholesterol (bad cholesterol) goal below 70 mg/dL. I also advised the patient to eat a healthy diet with plenty of whole grains, cereals, fruits and vegetables, exercise regularly and maintain ideal body weight.  Followup in the future with me as needed or call earlier if needed       Thank you for coming to see [place] at [facility]. I hope we have been able to provide you high quality care today.  You may receive a patient satisfaction survey over the next few weeks. We would appreciate your feedback and comments so that we may continue to improve ourselves and the health of our patients.

## 2019-01-15 NOTE — Progress Notes (Signed)
[facility] [address] [place] [address] [phone]       OFFICE FOLLOW UP NOTE  [name] Date of Birth:  1961-01-29 Medical Record Number:  [id number]   Reason for Referral:  hospital stroke follow up  CHIEF COMPLAINT:  Chief Complaint  Patient presents with  . Follow-up    Stroke follow up room 9  pt with Jordan Mendoza daughter saw Dr. Nedra Hai on Saturday and did blood work for fasting  they dont  have results    HPI: 01/15/19 visit  Jordan Mendoza is a 58 year old male who is being seen today for follow-up visit after left MCA infarct in 06/2018 and is accompanied by his daughter.  Continues to do well from a stroke standpoint without residual deficits or recurring of symptoms.  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  Continues on aspirin without side effects of bleeding or bruising.  Continues on atorvastatin without side effects myalgias.  He recently had lab work obtained by PCP with reported lipid panel being satisfactory.  Blood pressure today satisfactory at 125/80.  Loop recorder has not shown atrial fibrillation thus far.  Denies new or worsening stroke/TIA symptoms.    INITIAL VISIT 10/11/2018: Jordan Mendoza is being seen today for initial visit in the office for left MCA infarct secondary to unknown source on 07/08/2018. History obtained from patient, daughter and chart review. Reviewed all radiology images and labs personally.  JordanJordan Mendoza  at [facility] and then transferred to [facility] for further management.  CT head was negative for acute abnormality.  CTA head and neck unremarkable.  MRI brain reviewed and showed left MCA infarct.  Carotid Doppler showed bilateral ICA stenosis 1 to 39%.  2D echo showed an EF of 55 to 60% without cardiac  source of embolus identified.  Infarct felt to be embolic secondary to unknown source therefore TEE obtained which was negative for PFO and loop recorder was placed to rule out atrial fibrillation.  TEE did show wall motion abnormalities concerning for previous MI and recommended follow-up with cardiology as outpatient.  LDL 187 and A1c 5.5.  Hypercoagulable work-up negative.  Recommended DAPT for 3 weeks and aspirin alone.  Discharged on atorvastatin 80 mg daily.  Discharged home in stable condition without therapy needs.  Patient is being seen today for hospital follow-up and is accompanied by his daughter.  He states he has been recovering well without residual deficits or recurring of symptoms.  He does endorse fatigue but this has been improving.  He has been having difficulty with sleeping during the night as he awakens every couple of hours.  He does endorse snoring along with daytime fatigue prior to his stroke.  He denies any prior work-up for OSA.  He has returned to work full-time without complications.  Continues to take aspirin without side effects of bleeding or bruising as he is completed 3 weeks DAPT.  Continues to take atorvastatin 80 mg daily without side effects myalgias.  Blood pressure today satisfactory 120/74.  He recently established care with PCP yesterday who will be placing referral for cardiology follow-up.  No further concerns at this time.  Denies new or worsening stroke/TIA symptoms.  ROS:   14 system review of systems performed and negative with exception of see HPI  PMH:  Past Medical History:  Diagnosis Date  .  Hypothyroid 07/10/2018  . Smokes 1/2 pack a day or less 1990   started thirty years ago  . 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 LOOP RECORDER INSERTION N/A 07/10/2018   Procedure: LOOP RECORDER INSERTION;  Surgeon: Marinus Mawaylor, Gregg W, MD;  [facility];  Service: Cardiovascular;   Laterality: N/A;  . TEE WITHOUT CARDIOVERSION N/A 07/10/2018   Procedure: TRANSESOPHAGEAL ECHOCARDIOGRAM (TEE);  Surgeon: Laurey MoraleMcLean, Dalton S, MD;  [facility];  Service: Cardiovascular;  Laterality: N/A;    Social History:  Social History   Socioeconomic History  . Marital status: Widowed    Spouse name: Not on file  . Number of children: 3  . Years of education: Not on file  . Highest education level: Not on file  Occupational History  . Not on file  Social Needs  . Financial resource strain: Not on file  . Food insecurity:    Worry: Not on file    Inability: Not on file  . Transportation needs:    Medical: Not on file    Non-medical: Not on file  Tobacco Use  . Smoking status: Former Smoker    Packs/day: 2.00    Years: 30.00    Pack years: 60.00    Types: Cigarettes    Start date: 02/06/1989  . Smokeless tobacco: Never Used  Substance and Sexual Activity  . Alcohol use: Not on file  . Drug use: Never  . Sexual activity: Yes  Lifestyle  . Physical activity:    Days per week: 2 days    Minutes per session: 30 min  . Stress: To some extent  Relationships  . Social connections:    Talks on phone: More than three times a week    Gets together: Not on file    Attends religious service: More than 4 times per year    Active member of club or organization: Not on file    Attends meetings of clubs or organizations: Not on file    Relationship status: Not on file  . Intimate partner violence:    Fear of current or ex partner: No    Emotionally abused: No    Physically abused: No    Forced sexual activity: No  Other Topics Concern  . Not on file  Social History Narrative  . Not on file    Family History: No family history on file.  Medications:   Current Outpatient Medications on File Prior to Visit  Medication Sig Dispense Refill  . aspirin EC 81 MG EC tablet Take 1 tablet (81 mg total) by mouth daily.    Marland Kitchen. atorvastatin (LIPITOR) 80 MG tablet Take 1  tablet (80 mg total) by mouth daily at 6 PM. 30 tablet 0  . levothyroxine (SYNTHROID, LEVOTHROID) 25 MCG tablet Take 1 tablet (25 mcg total) by mouth daily before breakfast. 30 tablet 0  . vitamin B-12 1000 MCG tablet Take 1 tablet (1,000 mcg total) by mouth daily. 30 tablet 2   No current facility-administered medications on file prior to visit.     Allergies:  No Known Allergies   Physical Exam  Vitals:   01/15/19 1530  BP: 125/80  Pulse: 84  Weight: 196 lb (88.9 kg)   Body mass index is 30.7 kg/m. No exam data present  General: well developed, well nourished, pleasant middle-aged Caucasian male, seated, in no evident distress Head: head normocephalic and atraumatic.   Neck:  supple with no carotid or supraclavicular bruits Cardiovascular: regular rate and rhythm, no murmurs Musculoskeletal: no deformity Skin:  no rash/petichiae Vascular:  Normal pulses all extremities  Neurologic Exam Mental Status: Awake and fully alert. Oriented to place and time. Recent and remote memory intact. Attention span, concentration and fund of knowledge appropriate. Mood and affect appropriate.  Cranial Nerves: Pupils equal, briskly reactive to light. Extraocular movements full without nystagmus. Visual fields full to confrontation. Hearing intact. Facial sensation intact. Face, tongue, palate moves normally and symmetrically.  Motor: Normal bulk and tone. Normal strength in all tested extremity muscles. Sensory.: intact to touch , pinprick , position and vibratory sensation.  Coordination: Rapid alternating movements normal in all extremities. Finger-to-nose and heel-to-shin performed accurately bilaterally. Gait and Station: Arises from chair without difficulty. Stance is normal. Gait demonstrates normal stride length and balance. Able to heel, toe and tandem walk without difficulty.  Reflexes: 1+ and symmetric. Toes downgoing.     Diagnostic Data (Labs, Imaging, Testing)  CT head in  [place] - no acute abnormality  CTA head and neck in [place] - unremarkable  MRI Brain Wo Contrast Findings consistent with a left MCA branch vessel infarction with a 2-3 cm region of acute infarction in the left deep insula and frontal operculum. Mild swelling. Petechial blood products without frank hematoma. No significant mass effect or shift. Otherwise negative scan.  Transthoracic Echocardiogram  - Left ventricle: The cavity size was normal. Wall thickness wasnormal. The estimated ejection fraction was in the range of 55%to 60%. Hypokinesis of the mid and apical anteroseptal andanterior myocardium. Doppler parameters are consistent withabnormal left ventricular relaxation (grade 1 diastolicdysfunction).  TEE Normal LV size with mild focal basal septal hypertrophy. EF 50-55% with mid to apical anteroseptal and apical anterior severe hypokinesis. Normal RV size and systolic function. Normal right atrial size. Normal left atrial size, no LA appendage thrombus. Trivial TR. Trivial MR. Trileaflet aortic valve with no stenosis or regurgitation. Normal caliber thoracic aorta with grade 3 plaque in the descending thoracic aorta.  Impression: No source of embolism. Wall motion abnormalities concerning for prior MI.   Carotid Doppler Antegrade flow bilateral vertebral arteries. 1-39% stenosis bilateral ICAs. Intimal thickening bilateral CCAs  EEG This is a normalawakeelectroencephalogram. There are no focal lateralizing or epileptiform features.  CT chest 07/11/2018 1. Negative for mediastinal mass. Mediastinal widening on chest x-ray was likely technical. 2. Aortic and coronary atherosclerotic calcification. No embolic source seen in the chest in this patient with acute stroke. 3. Numerous cystic densities in the liver.   ASSESSMENT: Varian Innes is a 58 y.o. year old male here with left MCA infarct on 07/08/2018 secondary to unknown source therefore loop  recorder placed to rule out atrial fibrillation as potential cause. Vascular risk factors include HTN, HLD and tobacco use.  He is being seen today for follow-up visit and overall continues to do well from a stroke standpoint without residual deficits or recurring of symptoms.    PLAN:  1. Left MCA infarct: Continue aspirin 81 mg daily  and atorvastatin 80 mg for secondary stroke prevention. Maintain strict control of hypertension with blood pressure goal below 130/90, diabetes with hemoglobin A1c goal below 6.5% and cholesterol with LDL cholesterol (bad cholesterol) goal below 70 mg/dL.  I also advised the patient to eat a healthy diet with plenty of whole grains, cereals, fruits and vegetables, exercise regularly with at least 30 minutes of continuous activity daily and maintain ideal body weight.  Continue to monitor loop  recorder for atrial fibrillation 2. HTN: Advised to continue current treatment regimen.  Today's BP 120/74.  Advised to continue to monitor at home along with continued follow-up with PCP for management 3. HLD: Advised to continue current treatment regimen along with continued follow-up with PCP for future prescribing and monitoring of lipid panel   Follow-up as needed as stable from stroke standpoint but did advise to call in the future with questions, concerns or need of future follow-up appointment   Greater than 50% of time during this 25 minute visit was spent on counseling, explanation of diagnosis of left MCA infarct, reviewing risk factor management of HTN, HLD and possible OSA, planning of further management along with potential future management, and discussion with patient and family answering all questions.    George Hugh, AGNP-BC  [facility] [address] [place], [place] [address]  Phone [phone] Fax [phone] Note: This document was prepared with digital dictation and possible smart phrase technology. Any  transcriptional errors that result from this process are unintentional.

## 2019-01-15 NOTE — Progress Notes (Signed)
I agree with the above plan 

## 2019-01-24 ENCOUNTER — Ambulatory Visit (INDEPENDENT_AMBULATORY_CARE_PROVIDER_SITE_OTHER): Payer: 59 | Admitting: *Deleted

## 2019-01-24 DIAGNOSIS — I639 Cerebral infarction, unspecified: Secondary | ICD-10-CM | POA: Diagnosis not present

## 2019-01-25 LAB — CUP PACEART REMOTE DEVICE CHECK
Implantable Pulse Generator Implant Date: 20190813
MDC IDC SESS DTM: 20200227174128

## 2019-01-30 NOTE — Progress Notes (Signed)
Carelink Summary Report / Loop Recorder 

## 2019-02-26 ENCOUNTER — Ambulatory Visit (INDEPENDENT_AMBULATORY_CARE_PROVIDER_SITE_OTHER): Payer: 59 | Admitting: *Deleted

## 2019-02-26 ENCOUNTER — Other Ambulatory Visit: Payer: Self-pay

## 2019-02-26 DIAGNOSIS — I639 Cerebral infarction, unspecified: Secondary | ICD-10-CM

## 2019-02-26 LAB — CUP PACEART REMOTE DEVICE CHECK
Implantable Pulse Generator Implant Date: 20190813
MDC IDC SESS DTM: 20200331153317

## 2019-03-05 NOTE — Progress Notes (Signed)
Carelink Summary Report / Loop Recorder 

## 2019-04-01 ENCOUNTER — Ambulatory Visit (INDEPENDENT_AMBULATORY_CARE_PROVIDER_SITE_OTHER): Payer: 59 | Admitting: *Deleted

## 2019-04-01 ENCOUNTER — Other Ambulatory Visit: Payer: Self-pay

## 2019-04-01 DIAGNOSIS — I639 Cerebral infarction, unspecified: Secondary | ICD-10-CM | POA: Diagnosis not present

## 2019-04-01 LAB — CUP PACEART REMOTE DEVICE CHECK
Date Time Interrogation Session: 20200503183801
Implantable Pulse Generator Implant Date: 20190813

## 2019-04-08 NOTE — Progress Notes (Signed)
Carelink Summary Report / Loop Recorder 

## 2019-04-13 DIAGNOSIS — I2510 Atherosclerotic heart disease of native coronary artery without angina pectoris: Secondary | ICD-10-CM | POA: Diagnosis not present

## 2019-04-13 DIAGNOSIS — E538 Deficiency of other specified B group vitamins: Secondary | ICD-10-CM | POA: Diagnosis not present

## 2019-04-13 DIAGNOSIS — E785 Hyperlipidemia, unspecified: Secondary | ICD-10-CM | POA: Diagnosis not present

## 2019-04-13 DIAGNOSIS — I10 Essential (primary) hypertension: Secondary | ICD-10-CM | POA: Diagnosis not present

## 2019-04-13 DIAGNOSIS — E039 Hypothyroidism, unspecified: Secondary | ICD-10-CM | POA: Diagnosis not present

## 2019-05-03 ENCOUNTER — Ambulatory Visit (INDEPENDENT_AMBULATORY_CARE_PROVIDER_SITE_OTHER): Payer: 59 | Admitting: *Deleted

## 2019-05-03 DIAGNOSIS — I639 Cerebral infarction, unspecified: Secondary | ICD-10-CM | POA: Diagnosis not present

## 2019-05-03 LAB — CUP PACEART REMOTE DEVICE CHECK
Date Time Interrogation Session: 20200605173837
Implantable Pulse Generator Implant Date: 20190813

## 2019-05-10 NOTE — Progress Notes (Signed)
Carelink Summary Report / Loop Recorder 

## 2019-06-05 ENCOUNTER — Ambulatory Visit (INDEPENDENT_AMBULATORY_CARE_PROVIDER_SITE_OTHER): Payer: 59 | Admitting: *Deleted

## 2019-06-05 DIAGNOSIS — I639 Cerebral infarction, unspecified: Secondary | ICD-10-CM

## 2019-06-06 LAB — CUP PACEART REMOTE DEVICE CHECK
Date Time Interrogation Session: 20200708204011
Implantable Pulse Generator Implant Date: 20190813

## 2019-06-17 NOTE — Progress Notes (Signed)
Carelink Summary Report / Loop Recorder 

## 2019-07-02 IMAGING — CT CT CHEST W/ CM
2 of 3 series · 15 of 36 positions shown, 18 images · IV contrast (APPLIED)
Comparison: Chest x-ray from 3 days ago

CLINICAL DATA: Abnormal chest x-ray

EXAM:
CT CHEST WITH CONTRAST
TECHNIQUE: Multidetector CT imaging of the chest was performed during
intravenous contrast administration.
CONTRAST:  75mL OMNIPAQUE IOHEXOL 300 MG/ML  SOLN

[Series 3: thorax 2.0 i31f 2 · axial · 0.79mm/px · z∈[+728,+1008]mm · 12 of 166 slices shown, 15 images]
[im 13/166  mediastinal]
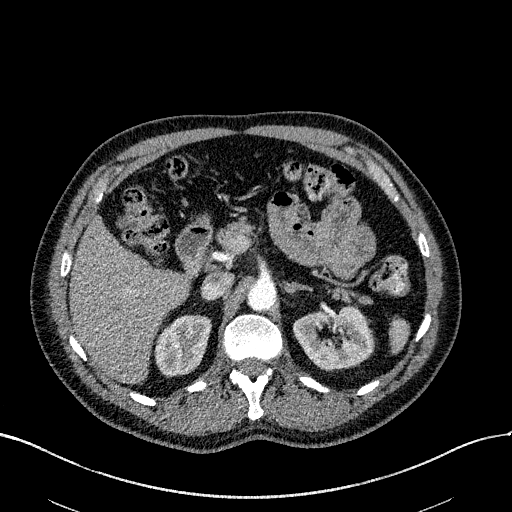
[im 13/166  lung]
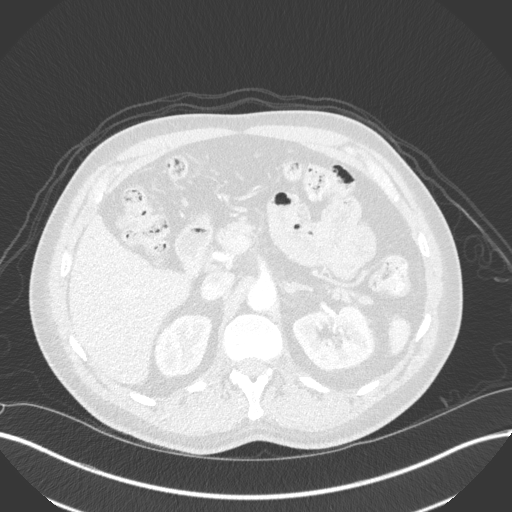
[im 25/166  lung]
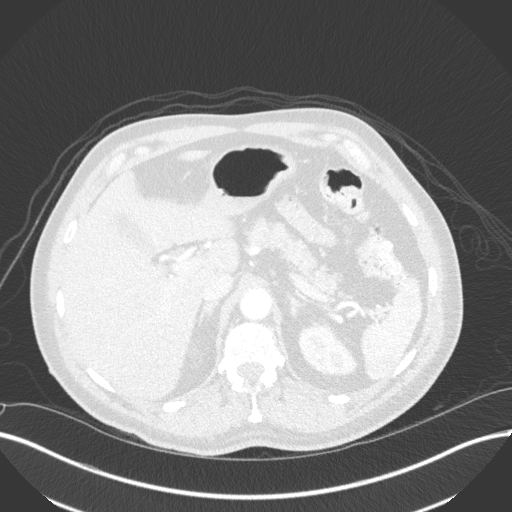
[im 37/166  lung]
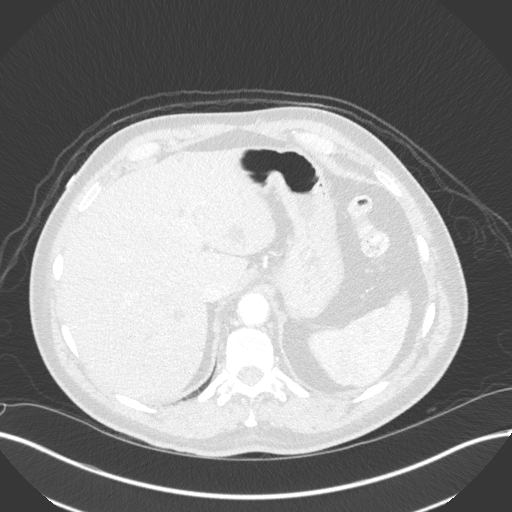
[im 49/166  lung]
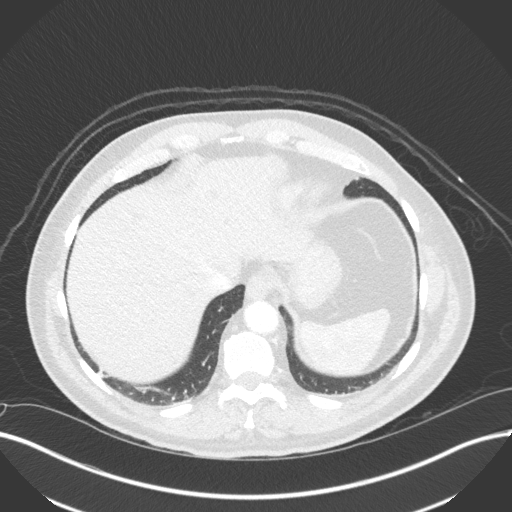
[im 62/166  mediastinal]
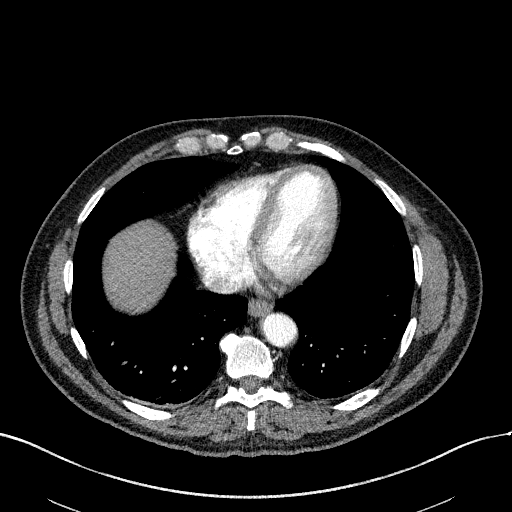
[im 62/166  lung]
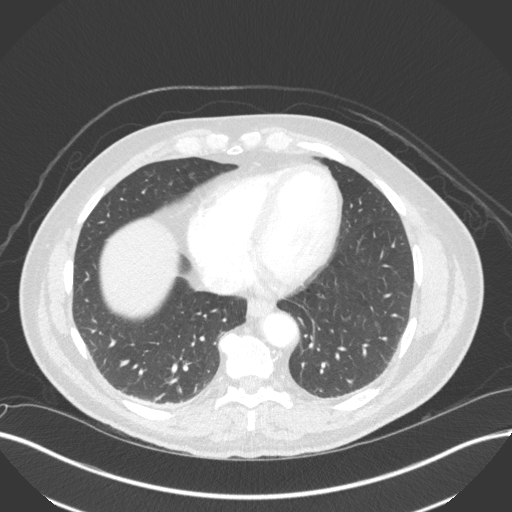
[im 74/166  lung]
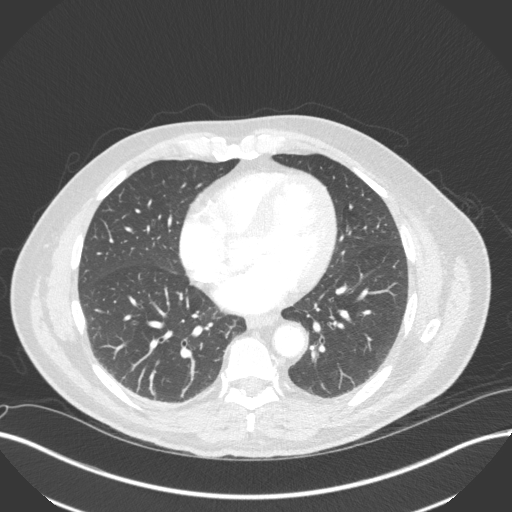
[im 92/166  lung]
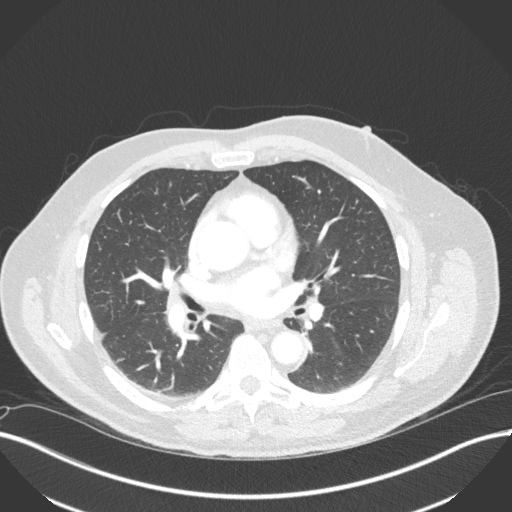
[im 104/166  lung]
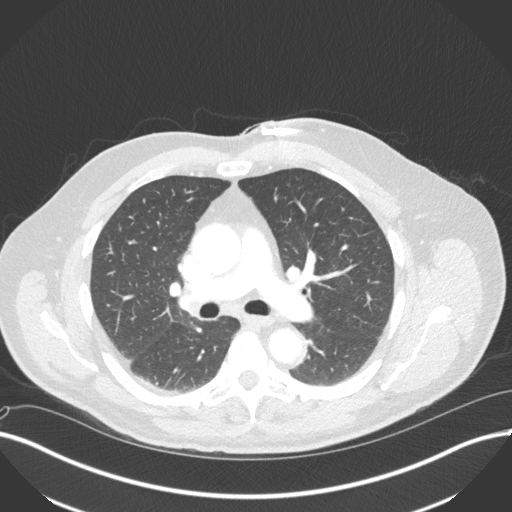
[im 117/166  mediastinal]
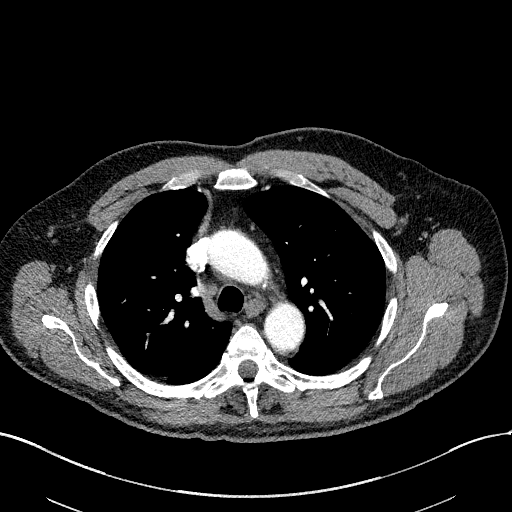
[im 117/166  lung]
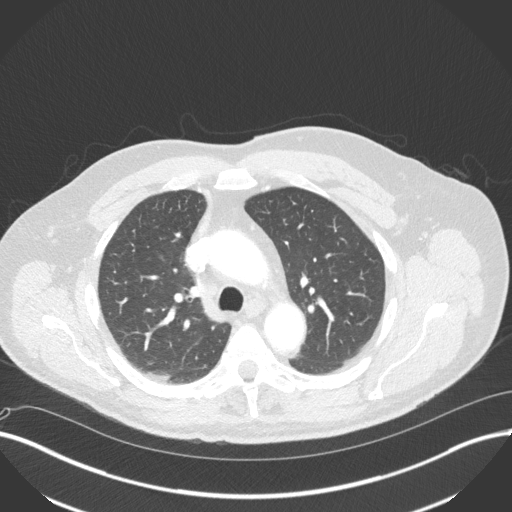
[im 129/166  lung]
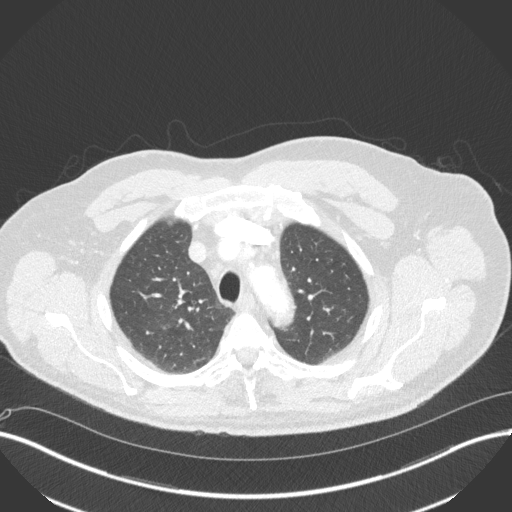
[im 141/166  lung]
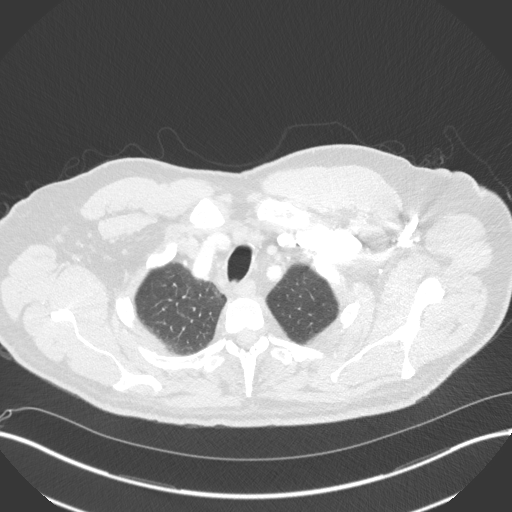
[im 153/166  lung]
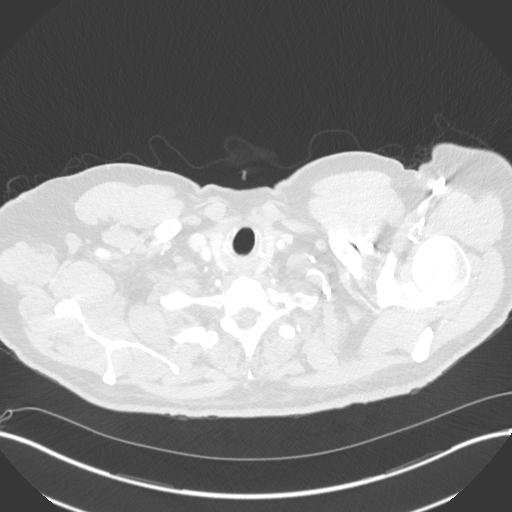

[Series 5: coronal · coronal · 0.62mm/px · 3 of 151 slices shown]
[im 31/151  lung]
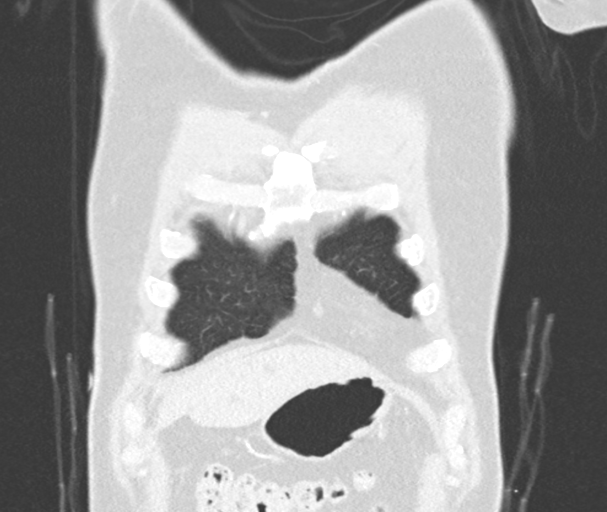
[im 61/151  lung]
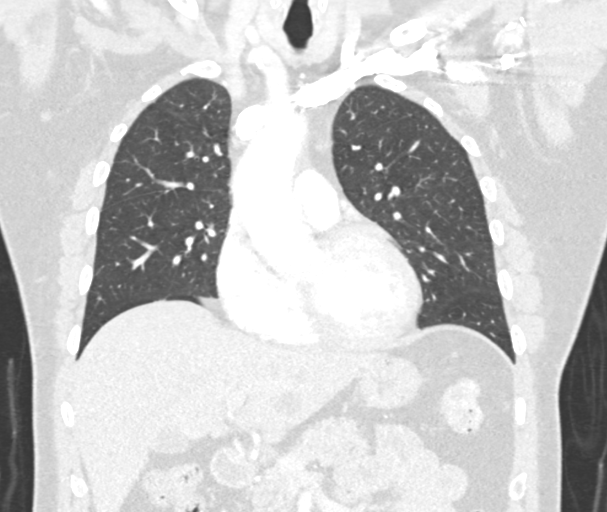
[im 91/151  lung]
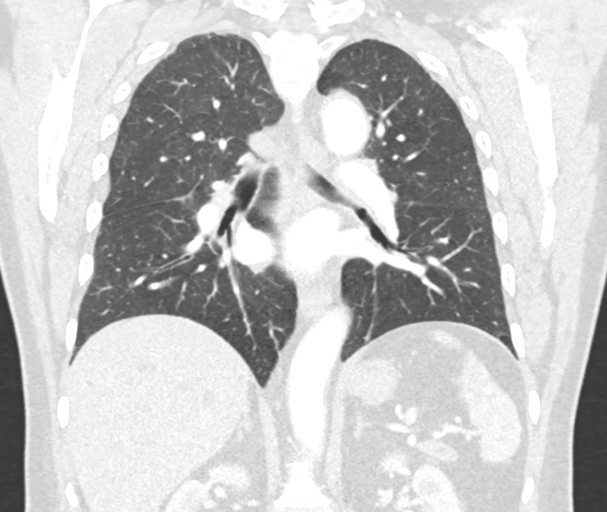

[15 of 36 positions shown; findings below may reference images not displayed]

FINDINGS: Cardiovascular: Normal heart size. No pericardial effusion. No
embolic source seen in this patient with acute stroke. There is
coronary and aortic atherosclerotic calcification. Implantable loop
recorder noted on the left.

Mediastinum/Nodes: Mild prominence of hilar lymph nodes size
measuring up to 1 cm short axis on the right. No mediastinal mass to
correlate with chest x-ray appearance which was likely from low
volumes and rotation.

Lungs/Pleura: The central airways are clear. There is no edema,
consolidation, effusion, or pneumothorax.

Upper Abdomen: Numerous cystic densities throughout the liver. The
largest in segment 6 has lobulation and measures up to 2.3 cm, these
have an overall benign appearance

Musculoskeletal: No acute or aggressive finding
IMPRESSION: 1. Negative for mediastinal mass. Mediastinal widening on chest
x-ray was likely technical.
2. Aortic and coronary atherosclerotic calcification. No embolic
source seen in the chest in this patient with acute stroke.
3. Numerous cystic densities in the liver.

## 2019-07-08 ENCOUNTER — Ambulatory Visit (INDEPENDENT_AMBULATORY_CARE_PROVIDER_SITE_OTHER): Payer: 59 | Admitting: *Deleted

## 2019-07-08 DIAGNOSIS — I639 Cerebral infarction, unspecified: Secondary | ICD-10-CM

## 2019-07-08 LAB — CUP PACEART REMOTE DEVICE CHECK
Date Time Interrogation Session: 20200810213602
Implantable Pulse Generator Implant Date: 20190813

## 2019-07-15 NOTE — Progress Notes (Signed)
Carelink Summary Report / Loop Recorder 

## 2019-08-11 LAB — CUP PACEART REMOTE DEVICE CHECK
Date Time Interrogation Session: 20200912214026
Implantable Pulse Generator Implant Date: 20190813

## 2019-08-12 ENCOUNTER — Ambulatory Visit (INDEPENDENT_AMBULATORY_CARE_PROVIDER_SITE_OTHER): Payer: 59 | Admitting: *Deleted

## 2019-08-12 DIAGNOSIS — I63411 Cerebral infarction due to embolism of right middle cerebral artery: Secondary | ICD-10-CM | POA: Diagnosis not present

## 2019-08-23 NOTE — Progress Notes (Signed)
Carelink Summary Report / Loop Recorder 

## 2019-09-12 ENCOUNTER — Ambulatory Visit (INDEPENDENT_AMBULATORY_CARE_PROVIDER_SITE_OTHER): Payer: 59 | Admitting: *Deleted

## 2019-09-12 DIAGNOSIS — I639 Cerebral infarction, unspecified: Secondary | ICD-10-CM

## 2019-09-14 LAB — CUP PACEART REMOTE DEVICE CHECK
Date Time Interrogation Session: 20201015213845
Implantable Pulse Generator Implant Date: 20190813

## 2019-09-25 NOTE — Progress Notes (Signed)
Carelink Summary Report / Loop Recorder 

## 2019-10-15 ENCOUNTER — Ambulatory Visit (INDEPENDENT_AMBULATORY_CARE_PROVIDER_SITE_OTHER): Payer: 59 | Admitting: *Deleted

## 2019-10-15 DIAGNOSIS — I639 Cerebral infarction, unspecified: Secondary | ICD-10-CM

## 2019-10-16 LAB — CUP PACEART REMOTE DEVICE CHECK
Date Time Interrogation Session: 20201117213916
Implantable Pulse Generator Implant Date: 20190813

## 2019-11-11 NOTE — Progress Notes (Signed)
Carelink Summary Report / Loop Recorder 

## 2019-11-18 ENCOUNTER — Ambulatory Visit (INDEPENDENT_AMBULATORY_CARE_PROVIDER_SITE_OTHER): Payer: 59 | Admitting: *Deleted

## 2019-11-18 DIAGNOSIS — E785 Hyperlipidemia, unspecified: Secondary | ICD-10-CM | POA: Diagnosis not present

## 2019-11-18 LAB — CUP PACEART REMOTE DEVICE CHECK
Date Time Interrogation Session: 20201220164331
Implantable Pulse Generator Implant Date: 20190813

## 2019-12-23 ENCOUNTER — Ambulatory Visit (INDEPENDENT_AMBULATORY_CARE_PROVIDER_SITE_OTHER): Payer: 59 | Admitting: *Deleted

## 2019-12-23 DIAGNOSIS — E785 Hyperlipidemia, unspecified: Secondary | ICD-10-CM | POA: Diagnosis not present

## 2019-12-23 LAB — CUP PACEART REMOTE DEVICE CHECK
Date Time Interrogation Session: 20210124233415
Implantable Pulse Generator Implant Date: 20190813

## 2020-01-27 ENCOUNTER — Ambulatory Visit (INDEPENDENT_AMBULATORY_CARE_PROVIDER_SITE_OTHER): Payer: 59 | Admitting: *Deleted

## 2020-01-27 DIAGNOSIS — E785 Hyperlipidemia, unspecified: Secondary | ICD-10-CM

## 2020-01-27 LAB — CUP PACEART REMOTE DEVICE CHECK
Date Time Interrogation Session: 20210228235715
Implantable Pulse Generator Implant Date: 20190813

## 2020-01-27 NOTE — Progress Notes (Signed)
ILR Remote 

## 2020-02-27 ENCOUNTER — Ambulatory Visit (INDEPENDENT_AMBULATORY_CARE_PROVIDER_SITE_OTHER): Payer: 59 | Admitting: *Deleted

## 2020-02-27 DIAGNOSIS — E785 Hyperlipidemia, unspecified: Secondary | ICD-10-CM

## 2020-02-27 LAB — CUP PACEART REMOTE DEVICE CHECK
Date Time Interrogation Session: 20210401010027
Implantable Pulse Generator Implant Date: 20190813

## 2020-02-27 NOTE — Progress Notes (Signed)
ILR Remote 

## 2020-03-29 LAB — CUP PACEART REMOTE DEVICE CHECK
Date Time Interrogation Session: 20210502011018
Implantable Pulse Generator Implant Date: 20190813

## 2020-03-30 ENCOUNTER — Ambulatory Visit (INDEPENDENT_AMBULATORY_CARE_PROVIDER_SITE_OTHER): Payer: 59 | Admitting: *Deleted

## 2020-03-30 DIAGNOSIS — I639 Cerebral infarction, unspecified: Secondary | ICD-10-CM | POA: Diagnosis not present

## 2020-03-30 NOTE — Progress Notes (Signed)
Carelink Summary Report / Loop Recorder 

## 2020-04-30 ENCOUNTER — Ambulatory Visit (INDEPENDENT_AMBULATORY_CARE_PROVIDER_SITE_OTHER): Payer: 59 | Admitting: *Deleted

## 2020-04-30 DIAGNOSIS — I639 Cerebral infarction, unspecified: Secondary | ICD-10-CM | POA: Diagnosis not present

## 2020-04-30 LAB — CUP PACEART REMOTE DEVICE CHECK
Date Time Interrogation Session: 20210602230950
Implantable Pulse Generator Implant Date: 20190813

## 2020-05-05 NOTE — Progress Notes (Signed)
Carelink Summary Report / Loop Recorder 

## 2020-06-02 ENCOUNTER — Ambulatory Visit (INDEPENDENT_AMBULATORY_CARE_PROVIDER_SITE_OTHER): Payer: 59 | Admitting: *Deleted

## 2020-06-02 DIAGNOSIS — I639 Cerebral infarction, unspecified: Secondary | ICD-10-CM | POA: Diagnosis not present

## 2020-06-02 LAB — CUP PACEART REMOTE DEVICE CHECK
Date Time Interrogation Session: 20210706022449
Implantable Pulse Generator Implant Date: 20190813

## 2020-06-03 NOTE — Progress Notes (Signed)
Carelink Summary Report / Loop Recorder 

## 2020-07-02 ENCOUNTER — Ambulatory Visit (INDEPENDENT_AMBULATORY_CARE_PROVIDER_SITE_OTHER): Payer: 59 | Admitting: *Deleted

## 2020-07-02 DIAGNOSIS — I639 Cerebral infarction, unspecified: Secondary | ICD-10-CM

## 2020-07-06 LAB — CUP PACEART REMOTE DEVICE CHECK
Date Time Interrogation Session: 20210808022437
Implantable Pulse Generator Implant Date: 20190813

## 2020-07-06 NOTE — Progress Notes (Signed)
Carelink Summary Report / Loop Recorder 

## 2020-08-07 ENCOUNTER — Ambulatory Visit (INDEPENDENT_AMBULATORY_CARE_PROVIDER_SITE_OTHER): Payer: 59 | Admitting: *Deleted

## 2020-08-07 DIAGNOSIS — I639 Cerebral infarction, unspecified: Secondary | ICD-10-CM | POA: Diagnosis not present

## 2020-08-07 LAB — CUP PACEART REMOTE DEVICE CHECK
Date Time Interrogation Session: 20210910022605
Implantable Pulse Generator Implant Date: 20190813

## 2020-08-07 NOTE — Progress Notes (Signed)
Carelink Summary Report / Loop Recorder 

## 2020-09-09 ENCOUNTER — Ambulatory Visit (INDEPENDENT_AMBULATORY_CARE_PROVIDER_SITE_OTHER): Payer: 59

## 2020-09-09 DIAGNOSIS — I639 Cerebral infarction, unspecified: Secondary | ICD-10-CM | POA: Diagnosis not present

## 2020-09-09 LAB — CUP PACEART REMOTE DEVICE CHECK
Date Time Interrogation Session: 20211013022737
Implantable Pulse Generator Implant Date: 20190813

## 2020-09-14 NOTE — Progress Notes (Signed)
Carelink Summary Report / Loop Recorder 

## 2020-10-12 ENCOUNTER — Ambulatory Visit (INDEPENDENT_AMBULATORY_CARE_PROVIDER_SITE_OTHER): Payer: 59

## 2020-10-12 DIAGNOSIS — I639 Cerebral infarction, unspecified: Secondary | ICD-10-CM

## 2020-10-12 LAB — CUP PACEART REMOTE DEVICE CHECK
Date Time Interrogation Session: 20211115013014
Implantable Pulse Generator Implant Date: 20190813

## 2020-10-13 NOTE — Progress Notes (Signed)
Carelink Summary Report / Loop Recorder 

## 2020-11-15 LAB — CUP PACEART REMOTE DEVICE CHECK
Date Time Interrogation Session: 20211218012951
Implantable Pulse Generator Implant Date: 20190813

## 2020-11-16 ENCOUNTER — Ambulatory Visit (INDEPENDENT_AMBULATORY_CARE_PROVIDER_SITE_OTHER): Payer: 59

## 2020-11-16 DIAGNOSIS — I639 Cerebral infarction, unspecified: Secondary | ICD-10-CM

## 2020-11-26 NOTE — Progress Notes (Signed)
Carelink Summary Report / Loop Recorder 

## 2020-12-17 LAB — CUP PACEART REMOTE DEVICE CHECK
Date Time Interrogation Session: 20220120014822
Implantable Pulse Generator Implant Date: 20190813

## 2020-12-21 ENCOUNTER — Ambulatory Visit (INDEPENDENT_AMBULATORY_CARE_PROVIDER_SITE_OTHER): Payer: 59

## 2020-12-21 DIAGNOSIS — I639 Cerebral infarction, unspecified: Secondary | ICD-10-CM

## 2020-12-31 NOTE — Progress Notes (Signed)
Carelink Summary Report / Loop Recorder 

## 2021-01-21 LAB — CUP PACEART REMOTE DEVICE CHECK
Date Time Interrogation Session: 20220222020016
Implantable Pulse Generator Implant Date: 20190813

## 2021-01-25 ENCOUNTER — Ambulatory Visit (INDEPENDENT_AMBULATORY_CARE_PROVIDER_SITE_OTHER): Payer: 59

## 2021-01-25 DIAGNOSIS — I639 Cerebral infarction, unspecified: Secondary | ICD-10-CM

## 2021-02-01 NOTE — Progress Notes (Signed)
Carelink Summary Report / Loop Recorder 

## 2021-02-21 LAB — CUP PACEART REMOTE DEVICE CHECK
Date Time Interrogation Session: 20220327030215
Implantable Pulse Generator Implant Date: 20190813

## 2021-02-22 ENCOUNTER — Ambulatory Visit (INDEPENDENT_AMBULATORY_CARE_PROVIDER_SITE_OTHER): Payer: 59

## 2021-02-22 DIAGNOSIS — I639 Cerebral infarction, unspecified: Secondary | ICD-10-CM

## 2021-03-03 NOTE — Progress Notes (Signed)
Carelink Summary Report / Loop Recorder 

## 2021-03-03 NOTE — Addendum Note (Signed)
Addended by: Geralyn Flash D on: 03/03/2021 04:30 PM   Modules accepted: Level of Service

## 2021-03-29 ENCOUNTER — Ambulatory Visit (INDEPENDENT_AMBULATORY_CARE_PROVIDER_SITE_OTHER): Payer: 59

## 2021-03-29 DIAGNOSIS — I639 Cerebral infarction, unspecified: Secondary | ICD-10-CM | POA: Diagnosis not present

## 2021-03-30 LAB — CUP PACEART REMOTE DEVICE CHECK
Date Time Interrogation Session: 20220502232114
Implantable Pulse Generator Implant Date: 20190813

## 2021-04-15 NOTE — Progress Notes (Signed)
Carelink Summary Report / Loop Recorder 

## 2021-05-02 LAB — CUP PACEART REMOTE DEVICE CHECK
Date Time Interrogation Session: 20220604231935
Implantable Pulse Generator Implant Date: 20190813

## 2021-05-03 ENCOUNTER — Ambulatory Visit (INDEPENDENT_AMBULATORY_CARE_PROVIDER_SITE_OTHER): Payer: 59

## 2021-05-03 DIAGNOSIS — I639 Cerebral infarction, unspecified: Secondary | ICD-10-CM

## 2021-05-24 NOTE — Progress Notes (Signed)
Carelink Summary Report / Loop Recorder 

## 2021-06-06 LAB — CUP PACEART REMOTE DEVICE CHECK
Date Time Interrogation Session: 20220707232439
Implantable Pulse Generator Implant Date: 20190813

## 2021-06-07 ENCOUNTER — Ambulatory Visit (INDEPENDENT_AMBULATORY_CARE_PROVIDER_SITE_OTHER): Payer: 59

## 2021-06-07 DIAGNOSIS — I639 Cerebral infarction, unspecified: Secondary | ICD-10-CM | POA: Diagnosis not present

## 2021-06-28 NOTE — Progress Notes (Signed)
Carelink Summary Report / Loop Recorder 

## 2021-07-12 ENCOUNTER — Ambulatory Visit (INDEPENDENT_AMBULATORY_CARE_PROVIDER_SITE_OTHER): Payer: 59

## 2021-07-12 DIAGNOSIS — I639 Cerebral infarction, unspecified: Secondary | ICD-10-CM

## 2021-07-12 LAB — CUP PACEART REMOTE DEVICE CHECK
Date Time Interrogation Session: 20220809232505
Implantable Pulse Generator Implant Date: 20190813

## 2021-07-30 NOTE — Progress Notes (Signed)
Carelink Summary Report / Loop Recorder 

## 2021-08-16 ENCOUNTER — Ambulatory Visit (INDEPENDENT_AMBULATORY_CARE_PROVIDER_SITE_OTHER): Payer: Self-pay

## 2021-08-16 DIAGNOSIS — I639 Cerebral infarction, unspecified: Secondary | ICD-10-CM

## 2021-08-17 LAB — CUP PACEART REMOTE DEVICE CHECK
Date Time Interrogation Session: 20220911234359
Implantable Pulse Generator Implant Date: 20190813

## 2021-08-19 NOTE — Progress Notes (Signed)
Carelink Summary Report / Loop Recorder 

## 2021-08-21 DIAGNOSIS — I679 Cerebrovascular disease, unspecified: Secondary | ICD-10-CM | POA: Diagnosis not present

## 2021-08-21 DIAGNOSIS — E538 Deficiency of other specified B group vitamins: Secondary | ICD-10-CM | POA: Diagnosis not present

## 2021-08-21 DIAGNOSIS — E785 Hyperlipidemia, unspecified: Secondary | ICD-10-CM | POA: Diagnosis not present

## 2021-08-21 DIAGNOSIS — I2510 Atherosclerotic heart disease of native coronary artery without angina pectoris: Secondary | ICD-10-CM | POA: Diagnosis not present

## 2021-08-21 DIAGNOSIS — E291 Testicular hypofunction: Secondary | ICD-10-CM | POA: Diagnosis not present

## 2021-09-16 LAB — CUP PACEART REMOTE DEVICE CHECK
Date Time Interrogation Session: 20221014234352
Implantable Pulse Generator Implant Date: 20190813

## 2021-09-20 ENCOUNTER — Ambulatory Visit (INDEPENDENT_AMBULATORY_CARE_PROVIDER_SITE_OTHER): Payer: 59

## 2021-09-20 DIAGNOSIS — I639 Cerebral infarction, unspecified: Secondary | ICD-10-CM

## 2021-09-27 NOTE — Progress Notes (Signed)
Carelink Summary Report / Loop Recorder 

## 2021-10-18 LAB — CUP PACEART REMOTE DEVICE CHECK
Date Time Interrogation Session: 20221116224617
Implantable Pulse Generator Implant Date: 20190813

## 2021-10-25 ENCOUNTER — Ambulatory Visit (INDEPENDENT_AMBULATORY_CARE_PROVIDER_SITE_OTHER): Payer: 59

## 2021-10-25 DIAGNOSIS — I639 Cerebral infarction, unspecified: Secondary | ICD-10-CM

## 2021-11-01 NOTE — Progress Notes (Signed)
Carelink Summary Report / Loop Recorder 

## 2021-11-16 ENCOUNTER — Ambulatory Visit (INDEPENDENT_AMBULATORY_CARE_PROVIDER_SITE_OTHER): Payer: Self-pay

## 2021-11-16 DIAGNOSIS — I639 Cerebral infarction, unspecified: Secondary | ICD-10-CM

## 2021-11-16 LAB — CUP PACEART REMOTE DEVICE CHECK
Date Time Interrogation Session: 20221219225205
Implantable Pulse Generator Implant Date: 20190813

## 2021-11-25 NOTE — Progress Notes (Signed)
Carelink Summary Report / Loop Recorder 

## 2021-12-04 DIAGNOSIS — I679 Cerebrovascular disease, unspecified: Secondary | ICD-10-CM | POA: Diagnosis not present

## 2021-12-04 DIAGNOSIS — I2510 Atherosclerotic heart disease of native coronary artery without angina pectoris: Secondary | ICD-10-CM | POA: Diagnosis not present

## 2021-12-04 DIAGNOSIS — J069 Acute upper respiratory infection, unspecified: Secondary | ICD-10-CM | POA: Diagnosis not present

## 2021-12-04 DIAGNOSIS — E039 Hypothyroidism, unspecified: Secondary | ICD-10-CM | POA: Diagnosis not present

## 2021-12-04 DIAGNOSIS — E538 Deficiency of other specified B group vitamins: Secondary | ICD-10-CM | POA: Diagnosis not present

## 2021-12-04 DIAGNOSIS — E785 Hyperlipidemia, unspecified: Secondary | ICD-10-CM | POA: Diagnosis not present

## 2021-12-20 ENCOUNTER — Ambulatory Visit (INDEPENDENT_AMBULATORY_CARE_PROVIDER_SITE_OTHER): Payer: Self-pay

## 2021-12-20 DIAGNOSIS — I639 Cerebral infarction, unspecified: Secondary | ICD-10-CM

## 2021-12-20 LAB — CUP PACEART REMOTE DEVICE CHECK
Date Time Interrogation Session: 20230122231811
Implantable Pulse Generator Implant Date: 20190813

## 2021-12-30 NOTE — Progress Notes (Signed)
Carelink Summary Report / Loop Recorder 

## 2022-01-24 ENCOUNTER — Ambulatory Visit (INDEPENDENT_AMBULATORY_CARE_PROVIDER_SITE_OTHER): Payer: Self-pay

## 2022-01-24 DIAGNOSIS — I639 Cerebral infarction, unspecified: Secondary | ICD-10-CM

## 2022-01-24 LAB — CUP PACEART REMOTE DEVICE CHECK
Date Time Interrogation Session: 20230224232121
Implantable Pulse Generator Implant Date: 20190813

## 2022-01-26 NOTE — Progress Notes (Signed)
Carelink Summary Report / Loop Recorder 

## 2022-02-28 ENCOUNTER — Ambulatory Visit (INDEPENDENT_AMBULATORY_CARE_PROVIDER_SITE_OTHER): Payer: Self-pay

## 2022-02-28 ENCOUNTER — Telehealth: Payer: Self-pay

## 2022-02-28 DIAGNOSIS — I639 Cerebral infarction, unspecified: Secondary | ICD-10-CM

## 2022-02-28 LAB — CUP PACEART REMOTE DEVICE CHECK
Date Time Interrogation Session: 20230401000500
Implantable Pulse Generator Implant Date: 20190813

## 2022-02-28 NOTE — Telephone Encounter (Signed)
LVM for patient to call device clinic back regarding LINQ at [facility] and options to have device taken out or leave it in.  ?

## 2022-03-10 NOTE — Progress Notes (Signed)
Carelink Summary Report / Loop Recorder 

## 2022-03-19 DIAGNOSIS — I2510 Atherosclerotic heart disease of native coronary artery without angina pectoris: Secondary | ICD-10-CM | POA: Diagnosis not present

## 2022-03-19 DIAGNOSIS — I679 Cerebrovascular disease, unspecified: Secondary | ICD-10-CM | POA: Diagnosis not present

## 2022-03-19 DIAGNOSIS — E785 Hyperlipidemia, unspecified: Secondary | ICD-10-CM | POA: Diagnosis not present

## 2022-03-19 DIAGNOSIS — E538 Deficiency of other specified B group vitamins: Secondary | ICD-10-CM | POA: Diagnosis not present

## 2022-03-25 NOTE — Telephone Encounter (Signed)
Letter sent to patient.

## 2022-06-18 DIAGNOSIS — E538 Deficiency of other specified B group vitamins: Secondary | ICD-10-CM | POA: Diagnosis not present

## 2022-06-18 DIAGNOSIS — E785 Hyperlipidemia, unspecified: Secondary | ICD-10-CM | POA: Diagnosis not present

## 2022-06-18 DIAGNOSIS — E039 Hypothyroidism, unspecified: Secondary | ICD-10-CM | POA: Diagnosis not present

## 2022-06-18 DIAGNOSIS — I2510 Atherosclerotic heart disease of native coronary artery without angina pectoris: Secondary | ICD-10-CM | POA: Diagnosis not present

## 2022-06-18 DIAGNOSIS — I679 Cerebrovascular disease, unspecified: Secondary | ICD-10-CM | POA: Diagnosis not present
